# Patient Record
Sex: Female | Born: 1990 | Race: White | Hispanic: No | Marital: Married | Smoking: Current every day smoker
Health system: Southern US, Community
[De-identification: ages and names within clinical notes are randomized; demographics above are authoritative.]

## PROBLEM LIST (undated history)

## (undated) DIAGNOSIS — F419 Anxiety disorder, unspecified: Secondary | ICD-10-CM

## (undated) DIAGNOSIS — F329 Major depressive disorder, single episode, unspecified: Secondary | ICD-10-CM

## (undated) DIAGNOSIS — M50223 Other cervical disc displacement at C6-C7 level: Secondary | ICD-10-CM

## (undated) DIAGNOSIS — F32A Depression, unspecified: Secondary | ICD-10-CM

## (undated) HISTORY — PX: FOOT SURGERY: SHX648

---

## 2013-09-13 ENCOUNTER — Encounter (HOSPITAL_BASED_OUTPATIENT_CLINIC_OR_DEPARTMENT_OTHER): Payer: Self-pay | Admitting: Emergency Medicine

## 2013-09-13 ENCOUNTER — Emergency Department (HOSPITAL_BASED_OUTPATIENT_CLINIC_OR_DEPARTMENT_OTHER)
Admission: EM | Admit: 2013-09-13 | Discharge: 2013-09-13 | Disposition: A | Discharge status: Home / Self Care | Payer: Self-pay | Attending: Emergency Medicine | Admitting: Emergency Medicine

## 2013-09-13 DIAGNOSIS — M791 Myalgia, unspecified site: Secondary | ICD-10-CM

## 2013-09-13 DIAGNOSIS — F17200 Nicotine dependence, unspecified, uncomplicated: Secondary | ICD-10-CM | POA: Insufficient documentation

## 2013-09-13 DIAGNOSIS — IMO0001 Reserved for inherently not codable concepts without codable children: Secondary | ICD-10-CM | POA: Insufficient documentation

## 2013-09-13 MED ORDER — HYDROCODONE-ACETAMINOPHEN 5-325 MG PO TABS: 1.0000 | ORAL_TABLET | ORAL | Status: DC | PRN

## 2013-09-13 MED ORDER — CYCLOBENZAPRINE HCL 5 MG PO TABS
5.0000 mg | ORAL_TABLET | Freq: Two times a day (BID) | ORAL | Status: DC | PRN
Start: 2013-09-13 — End: 2016-06-04

## 2013-09-13 NOTE — ED Provider Notes (Signed)
CSN: [id number]     Arrival date & time 09/13/13  1730 History   First MD Initiated Contact with Patient 09/13/13 1900     Chief Complaint  Patient presents with  . Numbness   (Consider location/radiation/quality/duration/timing/severity/associated sxs/prior Treatment) HPI Comments: Pt states that she got a toradol and benadryl shot about a month ago in her right upper arm:pt states that since then she has had pain in her whole upper right arm and now it is radiating up into her right neck:pt states that there is pain to where it almost feels numb:pt states that she has not had any problems with grip  The history is provided by the patient. No language interpreter was used.    History reviewed. No pertinent past medical history. Past Surgical History  Procedure Laterality Date  . Foot surgery     No family history on file. History  Substance Use Topics  . Smoking status: Current Some Day Smoker  . Smokeless tobacco: Not on file  . Alcohol Use: No   OB History   Grav Para Term Preterm Abortions TAB SAB Ect Mult Living                 Review of Systems  Constitutional: Negative.   Respiratory: Negative.   Cardiovascular: Negative.     Allergies  Pepto-bismol  Home Medications   Current Outpatient Rx  Name  Route  Sig  Dispense  Refill  . cyclobenzaprine (FLEXERIL) 5 MG tablet   Oral   Take 1 tablet (5 mg total) by mouth 2 (two) times daily as needed for muscle spasms.   15 tablet   0   . HYDROcodone-acetaminophen (NORCO/VICODIN) 5-325 MG per tablet   Oral   Take 1-2 tablets by mouth every 4 (four) hours as needed.   10 tablet   0    BP 114/84  Pulse 87  Temp(Src) 97.8 F (36.6 C) (Oral)  Resp 16  Ht 5' 7.5" (1.715 m)  Wt 200 lb (90.719 kg)  BMI 30.84 kg/m2  SpO2 100% Physical Exam  Nursing note and vitals reviewed. Constitutional: She is oriented to person, place, and time. She appears well-developed and well-nourished.  Cardiovascular: Normal rate and  regular rhythm.   Musculoskeletal: Normal range of motion.  Neurological: She is oriented to person, place, and time. She exhibits normal muscle tone. Coordination normal.  Skin:  No redness or swelling noted to the right upper arm    ED Course  Procedures (including critical care time) Labs Review Labs Reviewed - No data to display Imaging Review No results found.  EKG Interpretation   None       MDM   1. Muscle pain    Seems consistent with a cervical radiculopathy. Will treat with vicodin and flexeril   Teressa LowerVrinda Milik Gilreath, NP 09/13/13 1922

## 2013-09-13 NOTE — ED Provider Notes (Signed)
Medical screening examination/treatment/procedure(s) were performed by non-physician practitioner and as supervising physician I was immediately available for consultation/collaboration.  EKG Interpretation   None         Shanna CiscoMegan E Docherty, MD 09/13/13 1925

## 2013-09-13 NOTE — ED Notes (Signed)
Numbness in her right arm since getting and injection a month ago.

## 2013-09-13 NOTE — Discharge Instructions (Signed)
Musculoskeletal Pain °Musculoskeletal pain is muscle and boney aches and pains. These pains can occur in any part of the body. Your caregiver may treat you without knowing the cause of the pain. They may treat you if blood or urine tests, X-rays, and other tests were normal.  °CAUSES °There is often not a definite cause or reason for these pains. These pains may be caused by a type of germ (virus). The discomfort may also come from overuse. Overuse includes working out too hard when your body is not fit. Boney aches also come from weather changes. Bone is sensitive to atmospheric pressure changes. °HOME CARE INSTRUCTIONS  °· Ask when your test results will be ready. Make sure you get your test results. °· Only take over-the-counter or prescription medicines for pain, discomfort, or fever as directed by your caregiver. If you were given medications for your condition, do not drive, operate machinery or power tools, or sign legal documents for 24 hours. Do not drink alcohol. Do not take sleeping pills or other medications that may interfere with treatment. °· Continue all activities unless the activities cause more pain. When the pain lessens, slowly resume normal activities. Gradually increase the intensity and duration of the activities or exercise. °· During periods of severe pain, bed rest may be helpful. Lay or sit in any position that is comfortable. °· Putting ice on the injured area. °· Put ice in a bag. °· Place a towel between your skin and the bag. °· Leave the ice on for 15 to 20 minutes, 3 to 4 times a day. °· Follow up with your caregiver for continued problems and no reason can be found for the pain. If the pain becomes worse or does not go away, it may be necessary to repeat tests or do additional testing. Your caregiver may need to look further for a possible cause. °SEEK IMMEDIATE MEDICAL CARE IF: °· You have pain that is getting worse and is not relieved by medications. °· You develop chest pain  that is associated with shortness or breath, sweating, feeling sick to your stomach (nauseous), or throw up (vomit). °· Your pain becomes localized to the abdomen. °· You develop any new symptoms that seem different or that concern you. °MAKE SURE YOU:  °· Understand these instructions. °· Will watch your condition. °· Will get help right away if you are not doing well or get worse. °Document Released: 08/26/2005 Document Revised: 11/18/2011 Document Reviewed: 04/15/2008 °ExitCare® Patient Information ©2014 [employer]. ° °

## 2015-03-21 ENCOUNTER — Emergency Department (HOSPITAL_BASED_OUTPATIENT_CLINIC_OR_DEPARTMENT_OTHER)
Admission: EM | Admit: 2015-03-21 | Discharge: 2015-03-21 | Disposition: A | Discharge status: Home / Self Care | Payer: Self-pay | Attending: Emergency Medicine | Admitting: Emergency Medicine

## 2015-03-21 ENCOUNTER — Encounter (HOSPITAL_BASED_OUTPATIENT_CLINIC_OR_DEPARTMENT_OTHER): Payer: Self-pay

## 2015-03-21 DIAGNOSIS — Z720 Tobacco use: Secondary | ICD-10-CM | POA: Insufficient documentation

## 2015-03-21 DIAGNOSIS — L299 Pruritus, unspecified: Secondary | ICD-10-CM | POA: Insufficient documentation

## 2015-03-21 DIAGNOSIS — R61 Generalized hyperhidrosis: Secondary | ICD-10-CM | POA: Insufficient documentation

## 2015-03-21 DIAGNOSIS — R000 Tachycardia, unspecified: Secondary | ICD-10-CM | POA: Insufficient documentation

## 2015-03-21 DIAGNOSIS — Z3202 Encounter for pregnancy test, result negative: Secondary | ICD-10-CM | POA: Insufficient documentation

## 2015-03-21 LAB — RAPID URINE DRUG SCREEN, HOSP PERFORMED
AMPHETAMINES: NOT DETECTED
Barbiturates: NOT DETECTED
Benzodiazepines: NOT DETECTED
COCAINE: NOT DETECTED
Opiates: NOT DETECTED
Tetrahydrocannabinol: NOT DETECTED

## 2015-03-21 LAB — PREGNANCY, URINE: Preg Test, Ur: NEGATIVE

## 2015-03-21 MED ORDER — SODIUM CHLORIDE 0.9 % IV BOLUS (SEPSIS): 1000.0000 mL | Freq: Once | INTRAVENOUS | Status: DC

## 2015-03-21 NOTE — ED Notes (Signed)
Pt refuses IV and IV Fluids.

## 2015-03-21 NOTE — ED Provider Notes (Signed)
CSN: [id number]     Arrival date & time 03/21/15  1216 History   First MD Initiated Contact with Patient 03/21/15 1231     Chief Complaint  Patient presents with  . Pruritis     (Consider location/radiation/quality/duration/timing/severity/associated sxs/prior Treatment) HPI Joann Hunt is a 24 y.o. female who comes in for evaluation of itchiness. Patient states for the past 3 weeks she has had intense itchiness all over her body. She reports going to the [facility] this morning and receiving steroid's, Zantac and Atarax, which made the itching worse. She took Benadryl last night without relief. She denies any fevers, chills, vomiting, abdominal pain, new medications, detergents, environmental exposures, insect bites or recent travel. She denies any illicit drug use, specifically opiates. History reviewed. No pertinent past medical history. Past Surgical History  Procedure Laterality Date  . Foot surgery     No family history on file. History  Substance Use Topics  . Smoking status: Current Every Day Smoker  . Smokeless tobacco: Not on file  . Alcohol Use: Not on file   OB History    No data available     Review of Systems A 10 point review of systems was completed and was negative except for pertinent positives and negatives as mentioned in the history of present illness     Allergies  Pepto-bismol  Home Medications   Prior to Admission medications   Medication Sig Start Date End Date Taking? Authorizing Provider  cyclobenzaprine (FLEXERIL) 5 MG tablet Take 1 tablet (5 mg total) by mouth 2 (two) times daily as needed for muscle spasms. 09/13/13   Teressa LowerVrinda Pickering, NP   BP 108/57 mmHg  Pulse 105  Temp(Src) 98 F (36.7 C) (Oral)  Resp 18  Ht 5\' 7"  (1.702 m)  Wt 232 lb (105.235 kg)  BMI 36.33 kg/m2  SpO2 99%  LMP 02/13/2015 Physical Exam  Constitutional: She is oriented to person, place, and time. She appears well-developed and well-nourished.  Diaphoresis noted  to palms and soles of feet.  HENT:  Head: Normocephalic and atraumatic.  Mouth/Throat: Oropharynx is clear and moist. No oropharyngeal exudate.  Eyes: Conjunctivae are normal. Pupils are equal, round, and reactive to light. Right eye exhibits no discharge. Left eye exhibits no discharge. No scleral icterus.  Neck: Normal range of motion. Neck supple.  Cardiovascular: Regular rhythm and normal heart sounds.   Mild tachycardia  Pulmonary/Chest: Effort normal and breath sounds normal. No respiratory distress. She has no wheezes. She has no rales.  Abdominal: Soft. There is no tenderness.  Musculoskeletal: She exhibits no tenderness.  Neurological: She is alert and oriented to person, place, and time.  Cranial Nerves II-XII grossly intact  Skin: Skin is warm. No rash noted. She is diaphoretic.  No lesions noted anywhere on body.  Psychiatric: She has a normal mood and affect.  Nursing note and vitals reviewed.   ED Course  Procedures (including critical care time) Labs Review Labs Reviewed  PREGNANCY, URINE  URINE RAPID DRUG SCREEN, HOSP PERFORMED    Imaging Review No results found.   EKG Interpretation None     Meds given in ED:  Medications - No data to display  New Prescriptions   No medications on file   Filed Vitals:   03/21/15 1230 03/21/15 1321  BP: 122/73 108/57  Pulse: 112 105  Temp: 98 F (36.7 C)   TempSrc: Oral   Resp: 18 18  Height: 5\' 7"  (1.702 m)   Weight: 232 lb (105.235  kg)   SpO2: 100% 99%    MDM  Vitals stable , tachycardia is improving -afebrile Pt resting comfortably in ED. Upon reevaluation, diaphoresis has resolved. Patient appears comfortable lying on the bed. She has refused IV fluids, oral fluids. PE--physical exam is otherwise unremarkable. Labwork--UDS is negative, pregnancy negative.  DDX--patient reports she has been given a prescription for prednisone, Atarax, Zantac, but has not filled any of these prescriptions. I recommended  she feel these prescriptions and follow-up with PCP for further evaluation and management of symptoms. No evidence of other acute or emergent pathology at this time  I discussed all relevant lab findings and imaging results with pt and they verbalized understanding. Discussed f/u with PCP within 48 hrs and return precautions, pt very amenable to plan. Prior to patient discharge, I discussed and reviewed this case with Dr.Wofford   Final diagnoses:  Itchy skin       Joycie Peek, PA-C 03/21/15 1425  Blake Divine, MD 03/21/15 1541

## 2015-03-21 NOTE — Discharge Instructions (Signed)
You were evaluated in the ED today for itchiness. There does not appear to be an emergent cause her symptoms at this time. Please fill the prescriptions were given earlier. Follow-up with primary care for further elevation management of your symptoms. Return to ED for worsening symptoms.  Pruritus  Pruritus is an itch. There are many different problems that can cause an itch. Dry skin is one of the most common causes of itching. Most cases of itching do not require medical attention.  HOME CARE INSTRUCTIONS  Make sure your skin is moistened on a regular basis. A moisturizer that contains petroleum jelly is best for keeping moisture in your skin. If you develop a rash, you may try the following for relief:   Use corticosteroid cream.  Apply cool compresses to the affected areas.  Bathe with Epsom salts or baking soda in the bathwater.  Soak in colloidal oatmeal baths. These are available at your pharmacy.  Apply baking soda paste to the rash. Stir water into baking soda until it reaches a paste-like consistency.  Use an anti-itch lotion.  Take over-the-counter diphenhydramine medicine by mouth as the instructions direct.  Avoid scratching. Scratching may cause the rash to become infected. If itching is very bad, your caregiver may suggest prescription lotions or creams to lessen your symptoms.  Avoid hot showers, which can make itching worse. A cold shower may help with itching as long as you use a moisturizer after the shower. SEEK MEDICAL CARE IF: The itching does not go away after several days. Document Released: 05/08/2011 Document Revised: 01/10/2014 Document Reviewed: 05/08/2011 [facility] Patient Information 2015 [place], [place]. This information is not intended to replace advice given to you by your health care provider. Make sure you discuss any questions you have with your health care provider.

## 2015-03-21 NOTE — ED Notes (Addendum)
Pt reports generalized pruritis "for weeks" unrelieved after 1 dose of prednisone, zantac and atarax administered this am in another ER.  Given RX, however has not filled prescriptions.  No visible rash, denies fever or myalgias.  Report abdominal discomfort x 3 weeks that is relieved after taking TUMS.  Denies new foods, soaps, medications.

## 2015-03-21 NOTE — ED Notes (Signed)
Discussed IVF with pt and pt continues to decline. States "I'm not dehydrated and I don't want them".

## 2015-03-21 NOTE — ED Notes (Signed)
C/o itching all over body x 3 weeks-denies rash-[facility]-rx prednisone, anti itch med, zantac-states meds "are not working"

## 2015-03-21 NOTE — ED Notes (Signed)
Patient denies pain and is resting comfortably.  

## 2016-03-11 ENCOUNTER — Emergency Department (HOSPITAL_BASED_OUTPATIENT_CLINIC_OR_DEPARTMENT_OTHER): Payer: Medicaid - Out of State

## 2016-03-11 ENCOUNTER — Encounter (HOSPITAL_BASED_OUTPATIENT_CLINIC_OR_DEPARTMENT_OTHER): Payer: Self-pay | Admitting: *Deleted

## 2016-03-11 ENCOUNTER — Emergency Department (HOSPITAL_BASED_OUTPATIENT_CLINIC_OR_DEPARTMENT_OTHER)
Admission: EM | Admit: 2016-03-11 | Discharge: 2016-03-11 | Disposition: A | Discharge status: Home / Self Care | Payer: Medicaid - Out of State | Attending: Emergency Medicine | Admitting: Emergency Medicine

## 2016-03-11 DIAGNOSIS — R109 Unspecified abdominal pain: Secondary | ICD-10-CM | POA: Insufficient documentation

## 2016-03-11 DIAGNOSIS — F17200 Nicotine dependence, unspecified, uncomplicated: Secondary | ICD-10-CM | POA: Diagnosis not present

## 2016-03-11 DIAGNOSIS — G479 Sleep disorder, unspecified: Secondary | ICD-10-CM | POA: Diagnosis not present

## 2016-03-11 DIAGNOSIS — M549 Dorsalgia, unspecified: Secondary | ICD-10-CM | POA: Diagnosis present

## 2016-03-11 DIAGNOSIS — N12 Tubulo-interstitial nephritis, not specified as acute or chronic: Secondary | ICD-10-CM | POA: Diagnosis not present

## 2016-03-11 DIAGNOSIS — R52 Pain, unspecified: Secondary | ICD-10-CM

## 2016-03-11 LAB — URINALYSIS, ROUTINE W REFLEX MICROSCOPIC
Bilirubin Urine: NEGATIVE
Glucose, UA: NEGATIVE mg/dL
Ketones, ur: NEGATIVE mg/dL
Nitrite: NEGATIVE
Protein, ur: NEGATIVE mg/dL
Specific Gravity, Urine: 1.027 (ref 1.005–1.030)
pH: 7 (ref 5.0–8.0)

## 2016-03-11 LAB — PREGNANCY, URINE: Preg Test, Ur: NEGATIVE

## 2016-03-11 LAB — URINE MICROSCOPIC-ADD ON

## 2016-03-11 MED ORDER — DIAZEPAM 5 MG PO TABS: 10.0000 mg | ORAL_TABLET | Freq: Three times a day (TID) | ORAL | Status: DC | PRN

## 2016-03-11 MED ORDER — OXYCODONE-ACETAMINOPHEN 5-325 MG PO TABS: 1.0000 | ORAL_TABLET | Freq: Four times a day (QID) | ORAL | Status: DC | PRN

## 2016-03-11 MED ORDER — CEPHALEXIN 500 MG PO CAPS: 500.0000 mg | ORAL_CAPSULE | Freq: Four times a day (QID) | ORAL | Status: DC

## 2016-03-11 MED ORDER — OXYCODONE-ACETAMINOPHEN 5-325 MG PO TABS
2.0000 | ORAL_TABLET | Freq: Once | ORAL | Status: AC
  Administered 2016-03-11: 2 via ORAL
  Filled 2016-03-11: qty 2

## 2016-03-11 MED ORDER — ONDANSETRON 4 MG PO TBDP
4.0000 mg | ORAL_TABLET | Freq: Once | ORAL | Status: AC
  Administered 2016-03-11: 4 mg via ORAL
  Filled 2016-03-11: qty 1

## 2016-03-11 MED ORDER — CEPHALEXIN 250 MG PO CAPS
500.0000 mg | ORAL_CAPSULE | Freq: Once | ORAL | Status: AC
  Administered 2016-03-11: 500 mg via ORAL
  Filled 2016-03-11: qty 2

## 2016-03-11 NOTE — ED Notes (Signed)
Patient transported to CT via stretcher per tech. 
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
## 2016-03-11 NOTE — ED Notes (Signed)
MD at bedside. 

## 2016-03-11 NOTE — ED Notes (Signed)
States she has a pinched nerve in her neck. She has had back pain x 4 days. [facility] and had xrays that told her to limit weight lifting. She was given Valium. No relief and now her back is numb. She is ambulatory with guarding.

## 2016-03-13 LAB — URINE CULTURE

## 2016-06-04 ENCOUNTER — Emergency Department (HOSPITAL_BASED_OUTPATIENT_CLINIC_OR_DEPARTMENT_OTHER)
Admission: EM | Admit: 2016-06-04 | Discharge: 2016-06-04 | Disposition: A | Discharge status: Home / Self Care | Payer: Medicaid - Out of State | Attending: Emergency Medicine | Admitting: Emergency Medicine

## 2016-06-04 ENCOUNTER — Encounter (HOSPITAL_BASED_OUTPATIENT_CLINIC_OR_DEPARTMENT_OTHER): Payer: Self-pay | Admitting: Emergency Medicine

## 2016-06-04 DIAGNOSIS — T7840XA Allergy, unspecified, initial encounter: Secondary | ICD-10-CM

## 2016-06-04 DIAGNOSIS — Z87891 Personal history of nicotine dependence: Secondary | ICD-10-CM | POA: Insufficient documentation

## 2016-06-04 DIAGNOSIS — R112 Nausea with vomiting, unspecified: Secondary | ICD-10-CM

## 2016-06-04 LAB — CBC WITH DIFFERENTIAL/PLATELET
Basophils Absolute: 0 10*3/uL (ref 0.0–0.1)
Basophils Relative: 0 %
EOS PCT: 1 %
Eosinophils Absolute: 0.1 10*3/uL (ref 0.0–0.7)
HEMATOCRIT: 34.7 % — AB (ref 36.0–46.0)
Hemoglobin: 11.5 g/dL — ABNORMAL LOW (ref 12.0–15.0)
LYMPHS ABS: 2 10*3/uL (ref 0.7–4.0)
LYMPHS PCT: 24 %
MCH: 27.8 pg (ref 26.0–34.0)
MCHC: 33.1 g/dL (ref 30.0–36.0)
MCV: 83.8 fL (ref 78.0–100.0)
MONO ABS: 0.6 10*3/uL (ref 0.1–1.0)
MONOS PCT: 7 %
NEUTROS ABS: 5.7 10*3/uL (ref 1.7–7.7)
Neutrophils Relative %: 68 %
PLATELETS: 280 10*3/uL (ref 150–400)
RBC: 4.14 MIL/uL (ref 3.87–5.11)
RDW: 14.3 % (ref 11.5–15.5)
WBC: 8.3 10*3/uL (ref 4.0–10.5)

## 2016-06-04 LAB — BASIC METABOLIC PANEL
Anion gap: 7 (ref 5–15)
BUN: 15 mg/dL (ref 6–20)
CALCIUM: 8.8 mg/dL — AB (ref 8.9–10.3)
CO2: 23 mmol/L (ref 22–32)
Chloride: 108 mmol/L (ref 101–111)
Creatinine, Ser: 0.71 mg/dL (ref 0.44–1.00)
GFR calc Af Amer: >60 mL/min (ref >60)
Glucose, Bld: 100 mg/dL — ABNORMAL HIGH (ref 65–99)
Potassium: 3.1 mmol/L — ABNORMAL LOW (ref 3.5–5.1)
Sodium: 138 mmol/L (ref 135–145)

## 2016-06-04 MED ORDER — METHYLPREDNISOLONE SODIUM SUCC 125 MG IJ SOLR
125.0000 mg | Freq: Once | INTRAMUSCULAR | Status: AC
  Administered 2016-06-04: 125 mg via INTRAVENOUS
  Filled 2016-06-04: qty 2

## 2016-06-04 MED ORDER — SODIUM CHLORIDE 0.9 % IV BOLUS (SEPSIS)
1000.0000 mL | Freq: Once | INTRAVENOUS | Status: AC
  Administered 2016-06-04: 1000 mL via INTRAVENOUS

## 2016-06-04 MED ORDER — FAMOTIDINE IN NACL 20-0.9 MG/50ML-% IV SOLN
20.0000 mg | Freq: Once | INTRAVENOUS | Status: AC
  Administered 2016-06-04: 20 mg via INTRAVENOUS
  Filled 2016-06-04: qty 50

## 2016-06-04 MED ORDER — PROMETHAZINE HCL 25 MG/ML IJ SOLN
25.0000 mg | Freq: Once | INTRAMUSCULAR | Status: AC
  Administered 2016-06-04: 25 mg via INTRAVENOUS
  Filled 2016-06-04: qty 1

## 2016-06-04 MED ORDER — FAMOTIDINE IN NACL 20-0.9 MG/50ML-% IV SOLN
INTRAVENOUS | Status: AC
  Filled 2016-06-04: qty 50

## 2016-06-04 MED ORDER — DIPHENHYDRAMINE HCL 50 MG/ML IJ SOLN
50.0000 mg | Freq: Once | INTRAMUSCULAR | Status: AC
  Administered 2016-06-04: 50 mg via INTRAVENOUS
  Filled 2016-06-04: qty 1

## 2016-06-04 MED ORDER — ONDANSETRON HCL 4 MG/2ML IJ SOLN
4.0000 mg | Freq: Once | INTRAMUSCULAR | Status: AC
  Administered 2016-06-04: 4 mg via INTRAVENOUS
  Filled 2016-06-04: qty 2

## 2016-06-04 MED ORDER — EPINEPHRINE 0.3 MG/0.3ML IJ SOAJ: 0.3000 mg | Freq: Once | INTRAMUSCULAR | 0 refills | Status: AC

## 2016-06-04 MED ORDER — ONDANSETRON 4 MG PO TBDP: ORAL_TABLET | ORAL | 0 refills | Status: AC

## 2016-06-04 MED ORDER — PREDNISONE 50 MG PO TABS: 50.0000 mg | ORAL_TABLET | Freq: Every day | ORAL | 0 refills | Status: DC

## 2016-06-04 NOTE — ED Provider Notes (Signed)
[facility]   CSN: [id number] Arrival date & time: 06/04/16  1118     History   Chief Complaint Chief Complaint  Patient presents with  . Allergic Reaction    HPI Joann Hunt is a 25 y.o. female who presented with possible allergic reaction. States around 9:30 AM, she was outside and may have been bitten by a bug on the bilateral forearms. Shortly afterwards, she also some swelling in the area that resolved and then about half an hour later, she noticed rash on her anterior chest as well as face. Has subjective shortness of breath as well. Denies any new foods. He states that she had previous allergic reaction to Pepto-Bismol. She denies being pregnant.   The history is provided by the patient.    History reviewed. No pertinent past medical history.  There are no active problems to display for this patient.   Past Surgical History:  Procedure Laterality Date  . FOOT SURGERY      OB History    No data available       Home Medications    Prior to Admission medications   Not on File    Family History History reviewed. No pertinent family history.  Social History Social History  Substance Use Topics  . Smoking status: Former Games developermoker  . Smokeless tobacco: Current User  . Alcohol use No     Allergies   Pepto-bismol [bismuth]   Review of Systems Review of Systems  Skin: Positive for rash.  All other systems reviewed and are negative.    Physical Exam Updated Vital Signs BP 131/93   Pulse 101   Temp 98.2 F (36.8 C) (Oral)   Resp 18   Ht 5' 7.5" (1.715 m)   Wt 230 lb (104.3 kg)   SpO2 99%   BMI 35.49 kg/m   Physical Exam  Constitutional: She is oriented to person, place, and time.  Anxious   HENT:  Head: Normocephalic.  Right Ear: External ear normal.  Left Ear: External ear normal.  Posterior pharynx clear   Eyes: EOM are normal. Pupils are equal, round, and reactive to light.  Neck: Normal range of motion.    No stridor   Cardiovascular: Normal rate, regular rhythm and normal heart sounds.   Pulmonary/Chest: Effort normal and breath sounds normal.  No wheezing   Abdominal: Soft. Bowel sounds are normal. She exhibits no distension. There is no tenderness. There is no guarding.  Musculoskeletal: Normal range of motion.  Neurological: She is alert and oriented to person, place, and time.  Skin: Skin is warm.  Urticaria on anterior chest and face. No mucous membrane involvement. No back or extremity involvement   Psychiatric: She has a normal mood and affect.  Nursing note and vitals reviewed.    ED Treatments / Results  Labs (all labs ordered are listed, but only abnormal results are displayed) Labs Reviewed  CBC WITH DIFFERENTIAL/PLATELET - Abnormal; Notable for the following:       Result Value   Hemoglobin 11.5 (*)    HCT 34.7 (*)    All other components within normal limits  BASIC METABOLIC PANEL - Abnormal; Notable for the following:    Potassium 3.1 (*)    Glucose, Bld 100 (*)    Calcium 8.8 (*)    All other components within normal limits    EKG  EKG Interpretation None       Radiology No results found.  Procedures Procedures (including critical care  time)  Medications Ordered in ED Medications  famotidine (PEPCID) 20-0.9 MG/50ML-% IVPB (  Not Given 06/04/16 1233)  sodium chloride 0.9 % bolus 1,000 mL (0 mLs Intravenous Stopped 06/04/16 1319)  diphenhydrAMINE (BENADRYL) injection 50 mg (50 mg Intravenous Given 06/04/16 1159)  methylPREDNISolone sodium succinate (SOLU-MEDROL) 125 mg/2 mL injection 125 mg (125 mg Intravenous Given 06/04/16 1201)  famotidine (PEPCID) IVPB 20 mg premix (0 mg Intravenous Stopped 06/04/16 1234)  promethazine (PHENERGAN) injection 25 mg (25 mg Intravenous Given 06/04/16 1236)  ondansetron (ZOFRAN) injection 4 mg (4 mg Intravenous Given 06/04/16 1319)     Initial Impression / Assessment and Plan / ED Course  I have reviewed the triage vital  signs and the nursing notes.  Pertinent labs & imaging results that were available during my care of the patient were reviewed by me and considered in my medical decision making (see chart for details).  Clinical Course    Joann Hunt is a 25 y.o. female here with possible allergic reaction. Has subjective shortness of breath but OP clear and no stridor and lungs clear. Has some urticaria so likely allergic reaction. Will give pepcid, benadryl, solumedrol and reassess.   1:24 PM Given steroids, benadryl, pepcid. Had some nausea afterwards. Given zofran, phenergan and felt better. Rash improved. Has no subjective shortness of breath anymore. Will dc home with prednisone, benadryl, zofran prn.    Final Clinical Impressions(s) / ED Diagnoses   Final diagnoses:  None    New Prescriptions New Prescriptions   No medications on file     Charlynne Pander, MD 06/04/16 1325

## 2016-06-04 NOTE — ED Notes (Signed)
Patient denies pain and is resting comfortably.  

## 2016-06-04 NOTE — Discharge Instructions (Signed)
Take prednisone as prescribed. If you feel nauseated from it, take zofran as needed.   Take benadryl 50 mg every 6 hrs for rash and itchiness.   Carry epi pen with you. If you feel that your throat is closing and you have worse trouble breathing, give yourself a shot and come to the ED.   See your doctor   Return to ER if you have worse rash, trouble breathing, throat closing, vomiting, abdominal pain

## 2016-06-04 NOTE — ED Triage Notes (Signed)
Pt states she was at work and something bit her around 930, states she started getting hives and having SOB.  Pt talking in full sentences in triage

## 2016-06-26 ENCOUNTER — Emergency Department (HOSPITAL_BASED_OUTPATIENT_CLINIC_OR_DEPARTMENT_OTHER): Payer: Self-pay

## 2016-06-26 ENCOUNTER — Emergency Department (HOSPITAL_BASED_OUTPATIENT_CLINIC_OR_DEPARTMENT_OTHER)
Admission: EM | Admit: 2016-06-26 | Discharge: 2016-06-26 | Disposition: A | Discharge status: Home / Self Care | Payer: Self-pay | Attending: Emergency Medicine | Admitting: Emergency Medicine

## 2016-06-26 ENCOUNTER — Encounter (HOSPITAL_BASED_OUTPATIENT_CLINIC_OR_DEPARTMENT_OTHER): Payer: Self-pay | Admitting: Emergency Medicine

## 2016-06-26 DIAGNOSIS — R059 Cough, unspecified: Secondary | ICD-10-CM

## 2016-06-26 DIAGNOSIS — R05 Cough: Secondary | ICD-10-CM | POA: Insufficient documentation

## 2016-06-26 DIAGNOSIS — Z87891 Personal history of nicotine dependence: Secondary | ICD-10-CM | POA: Insufficient documentation

## 2016-06-26 MED ORDER — DEXAMETHASONE SODIUM PHOSPHATE 10 MG/ML IJ SOLN
10.0000 mg | Freq: Once | INTRAMUSCULAR | Status: AC
  Administered 2016-06-26: 10 mg via INTRAMUSCULAR
  Filled 2016-06-26: qty 1

## 2016-06-26 MED ORDER — HYDROCODONE-HOMATROPINE 5-1.5 MG/5ML PO SYRP: 5.0000 mL | ORAL_SOLUTION | Freq: Four times a day (QID) | ORAL | 0 refills | Status: DC | PRN

## 2016-06-26 MED FILL — HYDROCODONE-HOMATROPINE SOL: 5-1.5 | 5 days supply | Qty: 120 | Fill #0

## 2016-06-26 NOTE — ED Triage Notes (Addendum)
Pt c/o np cough x 3 wks since she was "bitten by something at work." Sts she also has a migraine now x 3-4 wks- "it gets worse when I put my hair up." Also c/o bilateral breast soreness.

## 2016-06-26 NOTE — ED Provider Notes (Signed)
[facility]   CSN: [id number] Arrival date & time: 06/26/16  0935     History   Chief Complaint Chief Complaint  Patient presents with  . Cough    HPI Joann Hunt is a 25 y.o. female.  HPI Patient presents with persistent cough for the last 3-4 weeks.  Also now has a headache.  Says she's had some wheezing.  The cough has been nonproductive.  When patient coughs hard she sometimes has a release of urine.  Denies any dysuria or back pain.  No change in odor of urine.  She does not smoke but does vape. History reviewed. No pertinent past medical history.  There are no active problems to display for this patient.   Past Surgical History:  Procedure Laterality Date  . FOOT SURGERY      OB History    No data available       Home Medications    Prior to Admission medications   Medication Sig Start Date End Date Taking? Authorizing Provider  HYDROcodone-homatropine (HYCODAN) 5-1.5 MG/5ML syrup Take 5 mLs by mouth every 6 (six) hours as needed for cough. 06/26/16   Nelva Nayobert Tahesha Skeet, MD  ondansetron (ZOFRAN ODT) 4 MG disintegrating tablet 4mg  ODT q6 hours prn nausea/vomit 06/04/16   Charlynne Panderavid Hsienta Yao, MD  predniSONE (DELTASONE) 50 MG tablet Take 1 tablet (50 mg total) by mouth daily. 06/04/16   Charlynne Panderavid Hsienta Yao, MD    Family History History reviewed. No pertinent family history.  Social History Social History  Substance Use Topics  . Smoking status: Former Games developermoker  . Smokeless tobacco: Current User  . Alcohol use No     Allergies   Pepto-bismol [bismuth]   Review of Systems Review of Systems All other systems reviewed and are negative  Physical Exam Updated Vital Signs BP 129/80   Pulse 75   Temp 98.3 F (36.8 C) (Oral)   Resp 16   Ht 5' 7.5" (1.715 m)   Wt 220 lb (99.8 kg)   LMP 05/22/2016   SpO2 100%   BMI 33.95 kg/m   Physical Exam  Physical Exam  Nursing note and vitals reviewed. Constitutional: She is oriented to  person, place, and time. She appears well-developed and well-nourished. No distress.  HENT:  Head: Normocephalic and atraumatic.  Eyes: Pupils are equal, round, and reactive to light.  Neck: Normal range of motion.  Cardiovascular: Normal rate and intact distal pulses.   Pulmonary/Chest: No respiratory distress.  No definite wheezing noted.  Patient able to speak in full sentences.   Abdominal: Normal appearance. She exhibits no distension.  Musculoskeletal: Normal range of motion.  no CVA tenderness. Neurological: She is alert and oriented to person, place, and time. No cranial nerve deficit.  Skin: Skin is warm and dry. No rash noted.  Psychiatric: She has a normal mood and affect. Her behavior is normal.   ED Treatments / Results  Labs (all labs ordered are listed, but only abnormal results are displayed) Labs Reviewed - No data to display  EKG  EKG Interpretation None       Radiology Dg Chest 2 View  Result Date: 06/26/2016 CLINICAL DATA:  Cough x3 weeks EXAM: CHEST  2 VIEW COMPARISON:  None. FINDINGS: Lungs are clear.  No pleural effusion or pneumothorax. The heart is normal in size. Visualized osseous structures are within normal limits. IMPRESSION: Normal chest radiographs. Electronically Signed   By: Charline BillsSriyesh  Krishnan M.D.   On: 06/26/2016 10:21  Procedures Procedures (including critical care time)  Medications Ordered in ED Medications  dexamethasone (DECADRON) injection 10 mg (not administered)     Initial Impression / Assessment and Plan / ED Course  I have reviewed the triage vital signs and the nursing notes.  Pertinent labs & imaging results that were available during my care of the patient were reviewed by me and considered in my medical decision making (see chart for details).  Clinical Course      Final Clinical Impressions(s) / ED Diagnoses   Final diagnoses:  Cough    New Prescriptions New Prescriptions   HYDROCODONE-HOMATROPINE  (HYCODAN) 5-1.5 MG/5ML SYRUP    Take 5 mLs by mouth every 6 (six) hours as needed for cough.     Nelva Nay, MD 06/26/16 1059

## 2017-05-17 ENCOUNTER — Encounter (HOSPITAL_BASED_OUTPATIENT_CLINIC_OR_DEPARTMENT_OTHER): Payer: Self-pay | Admitting: Emergency Medicine

## 2017-05-17 ENCOUNTER — Emergency Department (HOSPITAL_BASED_OUTPATIENT_CLINIC_OR_DEPARTMENT_OTHER)
Admission: EM | Admit: 2017-05-17 | Discharge: 2017-05-17 | Disposition: A | Discharge status: Home / Self Care | Payer: Medicaid Other | Attending: Emergency Medicine | Admitting: Emergency Medicine

## 2017-05-17 DIAGNOSIS — M791 Myalgia: Secondary | ICD-10-CM | POA: Diagnosis present

## 2017-05-17 DIAGNOSIS — Z87891 Personal history of nicotine dependence: Secondary | ICD-10-CM | POA: Insufficient documentation

## 2017-05-17 DIAGNOSIS — B349 Viral infection, unspecified: Secondary | ICD-10-CM | POA: Diagnosis not present

## 2017-05-17 DIAGNOSIS — Z79899 Other long term (current) drug therapy: Secondary | ICD-10-CM | POA: Diagnosis not present

## 2017-05-17 HISTORY — DX: Anxiety disorder, unspecified: F41.9

## 2017-05-17 HISTORY — DX: Depression, unspecified: F32.A

## 2017-05-17 HISTORY — DX: Major depressive disorder, single episode, unspecified: F32.9

## 2017-05-17 MED ORDER — IBUPROFEN 800 MG PO TABS: 800.0000 mg | ORAL_TABLET | Freq: Three times a day (TID) | ORAL | 0 refills | Status: AC

## 2017-05-17 MED ORDER — HYDROCODONE-HOMATROPINE 5-1.5 MG/5ML PO SYRP: 5.0000 mL | ORAL_SOLUTION | Freq: Four times a day (QID) | ORAL | 0 refills | Status: DC | PRN

## 2017-05-17 MED ORDER — ACETAMINOPHEN 500 MG PO TABS
1000.0000 mg | ORAL_TABLET | Freq: Once | ORAL | Status: AC
  Administered 2017-05-17: 1000 mg via ORAL
  Filled 2017-05-17: qty 2

## 2017-05-17 MED ORDER — IBUPROFEN 800 MG PO TABS
800.0000 mg | ORAL_TABLET | Freq: Once | ORAL | Status: AC
Start: 2017-05-17 — End: 2017-05-17
  Administered 2017-05-17: 800 mg via ORAL
  Filled 2017-05-17: qty 1

## 2017-05-17 NOTE — ED Triage Notes (Signed)
Patient states that she has felt bad over the last couple of days. Reports that she has had generalized body aches, nasal congestion and a dry cough. Patient reports that Tylenol, Motrin and Aleve over the counter today

## 2017-05-17 NOTE — Discharge Instructions (Signed)
1. Take ibuprofen 800 mg every 8 hours for body aches and fevers. 2. Buy over-the-counter Tylenol severe cold and sinus medication. You may get the daytime preparation and the nighttime preparation( to help you sleep). 3. You may take 1 teaspoon Hycodan cough syrup as prescribed in the evening for severe cough.

## 2017-05-17 NOTE — ED Notes (Signed)
ED Provider at bedside. 

## 2017-05-17 NOTE — ED Provider Notes (Signed)
[facility]   CSN: [id number] Arrival date & time: 05/17/17  1943     History   Chief Complaint Chief Complaint  Patient presents with  . Generalized Body Aches    HPI Joann Hunt is a 26 y.o. female.  HPI Patient reports that her son is sick. She is being seen together with him in the emergency department. He is in kindergarten and he came home sick from school last Thursday. She reports he had a lot of nasal congestion, fever and cough. 2 days later, patient reports she developed severe generalized body aches subjective fever, nasal congestion and ear fullness, cough with chest pain and moderate diarrhea. Patient reports she's been taking ibuprofen and Tylenol without relief. She reports she feels terrible. Patient works in a nursing home. Past Medical History:  Diagnosis Date  . Anxiety   . Depression     There are no active problems to display for this patient.   Past Surgical History:  Procedure Laterality Date  . FOOT SURGERY      OB History    No data available       Home Medications    Prior to Admission medications   Medication Sig Start Date End Date Taking? Authorizing Provider  FLUoxetine (PROZAC) 10 MG tablet Take 10 mg by mouth daily.   Yes [provider]  sertraline (ZOLOFT) 25 MG tablet Take 25 mg by mouth daily.   Yes [provider]  HYDROcodone-homatropine (HYCODAN) 5-1.5 MG/5ML syrup Take 5 mLs by mouth every 6 (six) hours as needed for cough. 06/26/16   Nelva Nay, MD  HYDROcodone-homatropine [facility]) 5-1.5 MG/5ML syrup Take 5 mLs by mouth every 6 (six) hours as needed for cough. 05/17/17   Arby Barrette, MD  ibuprofen (ADVIL,MOTRIN) 800 MG tablet Take 1 tablet (800 mg total) by mouth 3 (three) times daily. 05/17/17   Arby Barrette, MD  ondansetron (ZOFRAN ODT) 4 MG disintegrating tablet  ODT q6 hours prn nausea/vomit 06/04/16   Charlynne Pander, MD  predniSONE (DELTASONE) 50 MG tablet Take  1 tablet (50 mg total) by mouth daily. 06/04/16   Charlynne Pander, MD    Family History History reviewed. No pertinent family history.  Social History Social History  Substance Use Topics  . Smoking status: Former Games developer  . Smokeless tobacco: Current User  . Alcohol use No     Allergies   Pepto-bismol [bismuth]   Review of Systems Review of Systems 10 Systems reviewed and are negative for acute change except as noted in the HPI.   Physical Exam Updated Vital Signs BP 133/84 (BP Location: Left Arm)   Pulse (!) 104   Temp 99.2 F (37.3 C) (Oral)   Resp 20   Ht 5' 7.5" (1.715 m)   Wt 99.8 kg (220 lb)   LMP 04/20/2017   SpO2 100%   BMI 33.95 kg/m   Physical Exam  Constitutional: She is oriented to person, place, and time.  Patient is nontoxic and alert. She appears mildly uncomfortable and congested but no respiratory distress, normal color and level of alertness.  HENT:  Head: Normocephalic and atraumatic.  Bilateral TMs normal. Nares are patent with mild clear congestion. Posterior oropharynx widely patent. No erythema or exudate of the tonsillar pillars.  Eyes: Pupils are equal, round, and reactive to light. EOM are normal.  Neck: Neck supple.  Cardiovascular: Normal rate, regular rhythm, normal heart sounds and intact distal pulses.   Pulmonary/Chest: Effort normal and breath  sounds normal.  Intermittent dry cough.  Abdominal: Soft. She exhibits no distension. There is no tenderness. There is no guarding.  Musculoskeletal: Normal range of motion. She exhibits no edema or tenderness.  Lymphadenopathy:    She has no cervical adenopathy.  Neurological: She is alert and oriented to person, place, and time. No cranial nerve deficit. She exhibits normal muscle tone.  Skin: Skin is warm and dry. No rash noted.  Psychiatric: She has a normal mood and affect.     ED Treatments / Results  Labs (all labs ordered are listed, but only abnormal results are  displayed) Labs Reviewed - No data to display  EKG  EKG Interpretation None       Radiology No results found.  Procedures Procedures (including critical care time)  Medications Ordered in ED Medications  ibuprofen (ADVIL,MOTRIN) tablet 800 mg (800 mg Oral Given 05/17/17 2228)  acetaminophen (TYLENOL) tablet 1,000 mg (1,000 mg Oral Given 05/17/17 2228)     Initial Impression / Assessment and Plan / ED Course  I have reviewed the triage vital signs and the nursing notes.  Pertinent labs & imaging results that were available during my care of the patient were reviewed by me and considered in my medical decision making (see chart for details).     Final Clinical Impressions(s) / ED Diagnoses   Final diagnoses:  Viral illness   Patient is clinically nontoxic with no respiratory distress and normal physical exam other than occasional cough. Patient is direct exposure to viral illness via her kindergartner. At this time, I feel the patient is appropriate for symptomatic treatment and does not need additional diagnostic testing at this time. Patient is counseled on the use of ibuprofen as well as over-the-counter acetaminophen containing decongestant. She is given Hycodan for nighttime cough.  New Prescriptions New Prescriptions   HYDROCODONE-HOMATROPINE (HYCODAN) 5-1.5 MG/5ML SYRUP    Take 5 mLs by mouth every 6 (six) hours as needed for cough.   IBUPROFEN (ADVIL,MOTRIN) 800 MG TABLET    Take 1 tablet (800 mg total) by mouth 3 (three) times daily.     Arby BarrettePfeiffer, Regla Fitzgibbon, MD 05/17/17 2232

## 2017-09-10 IMAGING — CT CT RENAL STONE PROTOCOL
2 of 4 series · 17 of 46 positions shown, 19 images · non-contrast
Comparison: None.

CLINICAL DATA: Left flank pain for 3-4 days.  Hematuria.

EXAM:
CT ABDOMEN AND PELVIS WITHOUT CONTRAST
TECHNIQUE: Multidetector CT imaging of the abdomen and pelvis was performed
following the standard protocol without IV contrast.

[Series 2: axial st · axial · 0.98mm/px · z∈[+385,+820]mm · 14 of 95 slices shown, 16 images]
[im 4/95  soft-tissue]
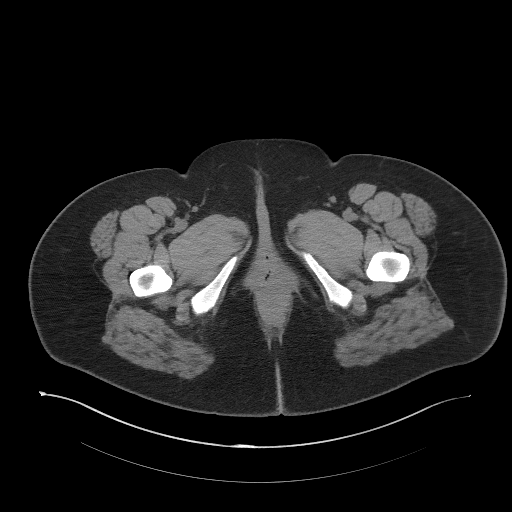
[im 4/95  bone]
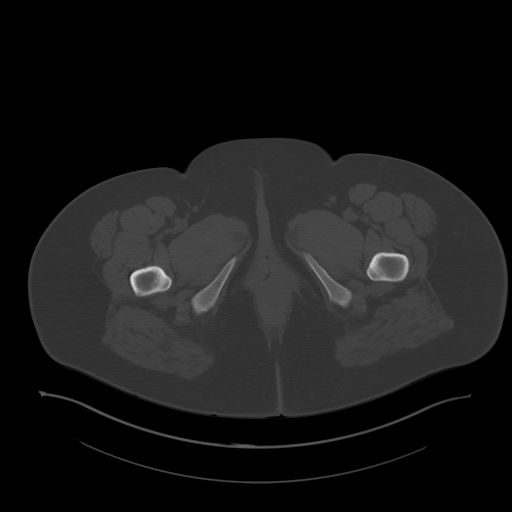
[im 12/95  soft-tissue]
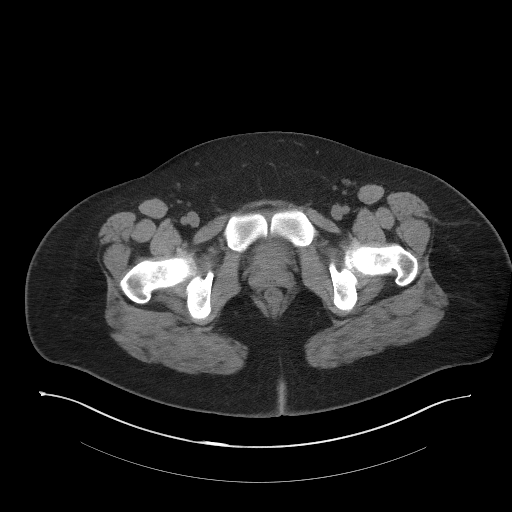
[im 19/95  soft-tissue]
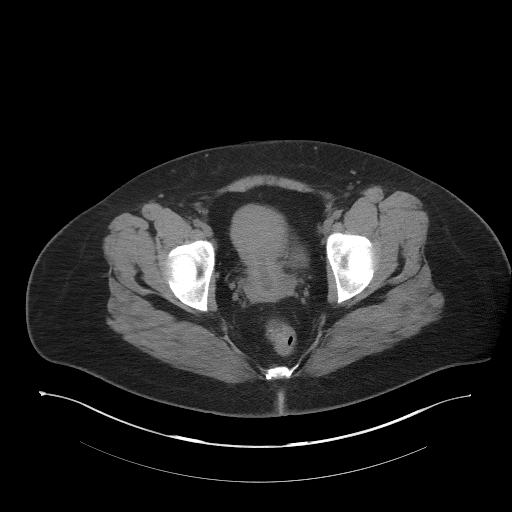
[im 27/95  soft-tissue]
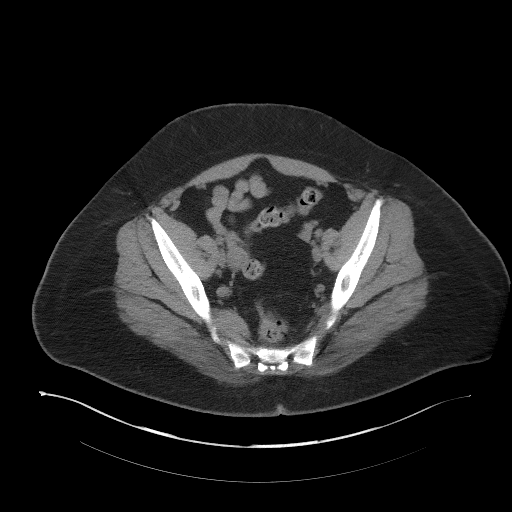
[im 31/95  soft-tissue]
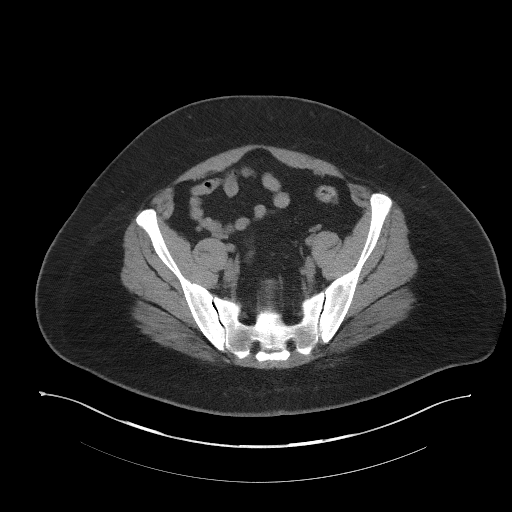
[im 38/95  soft-tissue]
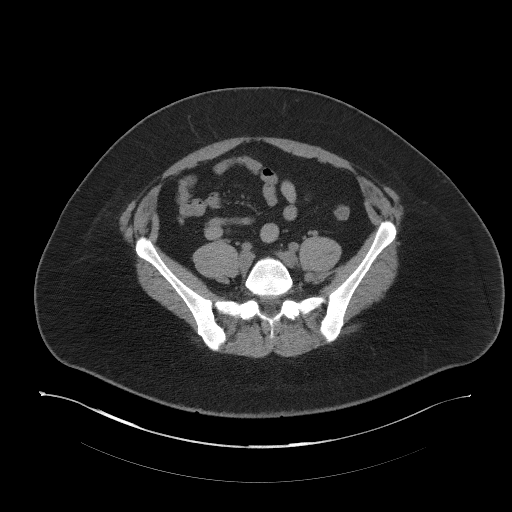
[im 46/95  soft-tissue]
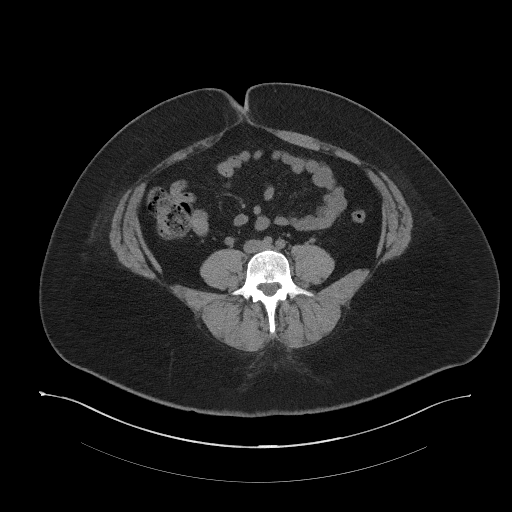
[im 49/95  soft-tissue]
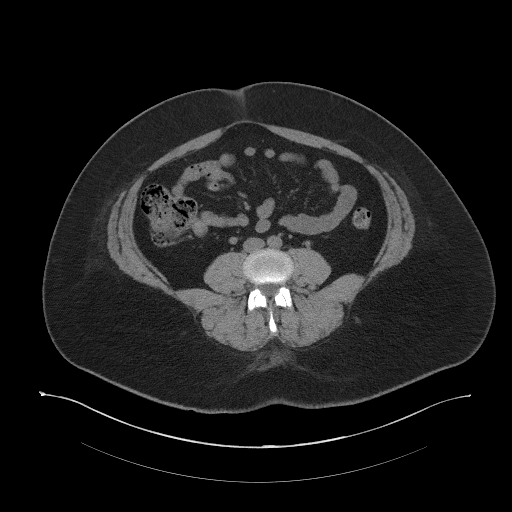
[im 57/95  soft-tissue]
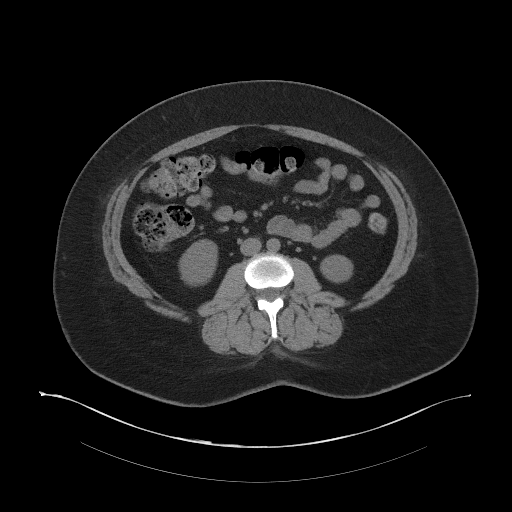
[im 57/95  bone]
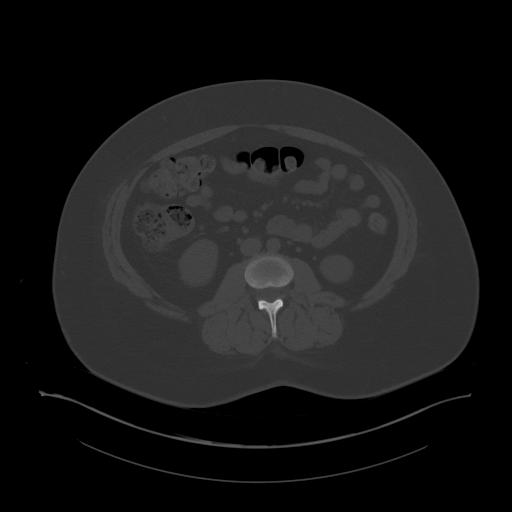
[im 64/95  soft-tissue]
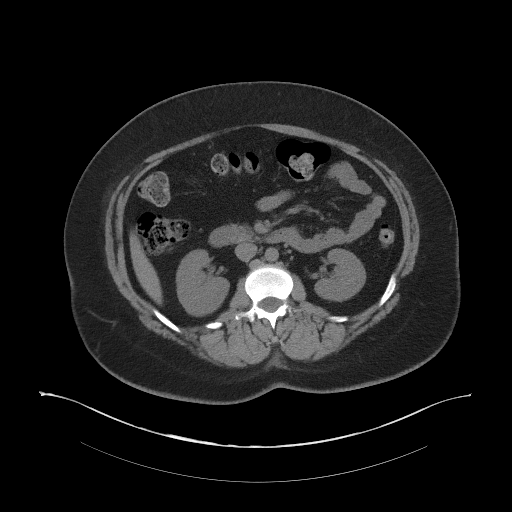
[im 72/95  soft-tissue]
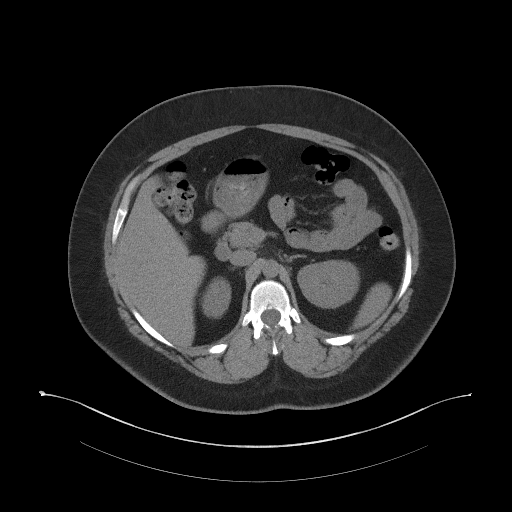
[im 76/95  soft-tissue]
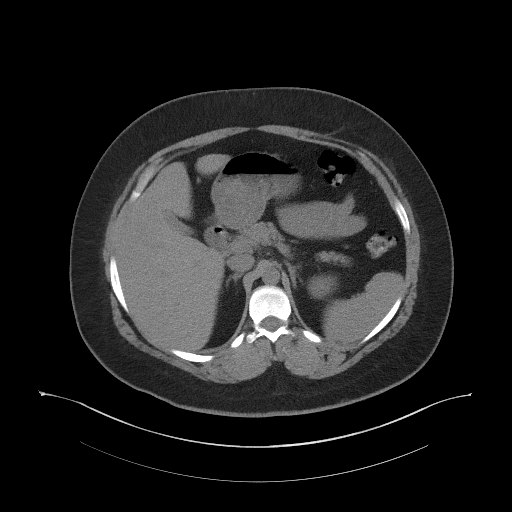
[im 83/95  soft-tissue]
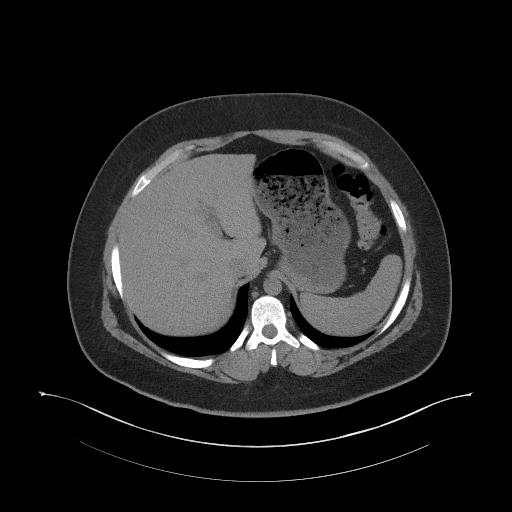
[im 91/95  soft-tissue]
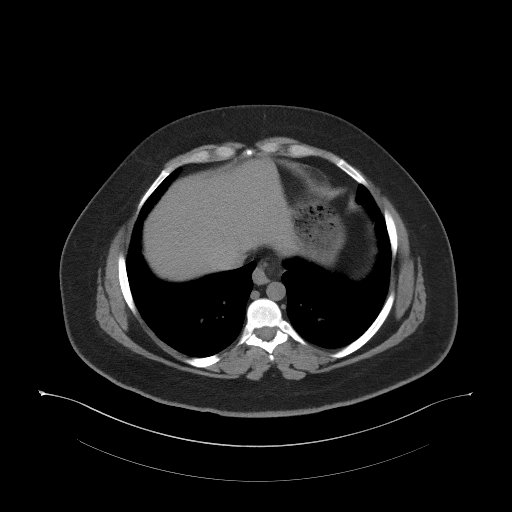

[Series 5: coronal st · coronal · 0.85mm/px · 3 of 102 slices shown]
[im 34/102  soft-tissue]
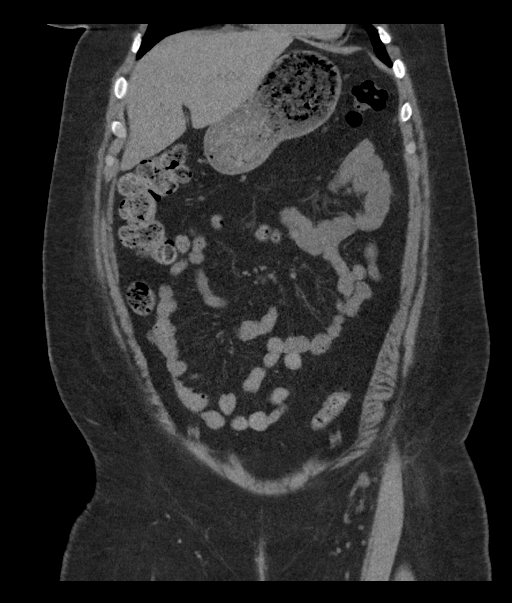
[im 45/102  soft-tissue]
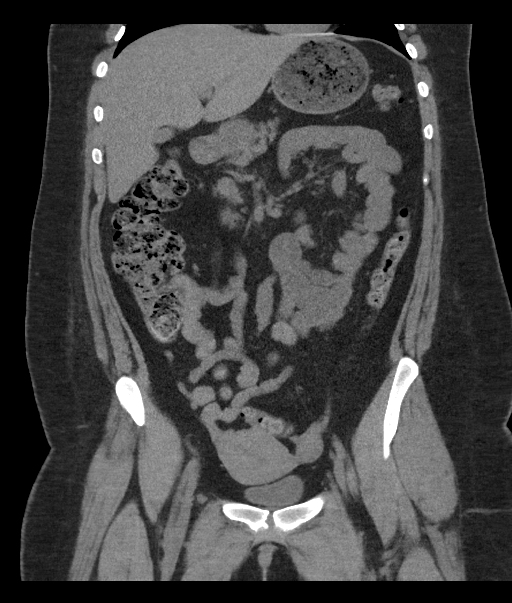
[im 57/102  soft-tissue]
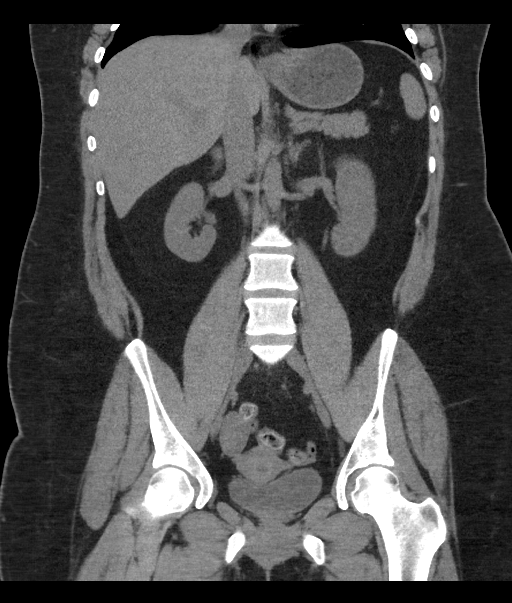

[17 of 46 positions shown; findings below may reference images not displayed]

FINDINGS: Lower chest: Lung bases are clear. No effusions. Heart is normal
size.

Hepatobiliary: No focal hepatic abnormality. Gallbladder
unremarkable. Gallbladder is contracted.

Pancreas: No focal abnormality or ductal dilatation.

Spleen: No focal abnormality.  Normal size.

Adrenals/Urinary Tract: No adrenal abnormality. No focal renal
abnormality. No stones or hydronephrosis. Urinary bladder is
unremarkable.

Stomach/Bowel: Small hiatal hernia. Stomach and small bowel
decompressed, grossly unremarkable. Large bowel unremarkable.
Appendix is normal.

Vascular/Lymphatic: No evidence of aneurysm or adenopathy.

Reproductive: Uterus and adnexa unremarkable.  No mass.

Other: No free fluid or free air.

Musculoskeletal: No acute bony abnormality or focal bone lesion.
IMPRESSION: Unremarkable noncontrast study. No renal or ureteral stones. No
hydronephrosis.

## 2017-10-09 ENCOUNTER — Other Ambulatory Visit: Payer: Self-pay | Admitting: Neurosurgery

## 2017-10-21 NOTE — Pre-Procedure Instructions (Signed)
Sevannah Ferdinand  10/21/2017      [id number] - [place], [place] - [address] [facility] [place] [address] Phone: [phone] Fax: [phone]    Your procedure is scheduled on Wednesday, Feb. 20th   Report to [facility] Admitting at  6:30 AM   Call this number if you have problems the morning of surgery:  [phone]   Remember:              4-5 days prior to surgery, STOP TAKING any Vitamins, Herbal Supplements, Anti-inflammatories, Blood Thinner.   Do not eat food or drink liquids after midnight, Tuesday.   Take these medicines the morning of surgery with A SIP OF WATER : Oxycodone   Do not wear jewelry, make-up or nail polish.  Do not wear lotions, powders,  perfumes, or deodorant.  Do not shave 48 hours prior to surgery.     Do not bring valuables to the hospital.  [facility] is not responsible for any belongings or valuables.  Contacts, dentures or bridgework may not be worn into surgery.  Leave your suitcase in the car.  After surgery it may be brought to your room.  For patients admitted to the hospital, discharge time will be determined by your treatment team.  Please read over the following fact sheets that you were given. Pain Booklet, MRSA Information and Surgical Site Infection Prevention      [facility]- Preparing For Surgery  Before surgery, you can play an important role. Because skin is not sterile, your skin needs to be as free of germs as possible. You can reduce the number of germs on your skin by washing with CHG (chlorahexidine gluconate) Soap before surgery.  CHG is an antiseptic cleaner which kills germs and bonds with the skin to continue killing germs even after washing.  Please do not use if you have an allergy to CHG or antibacterial soaps. If your skin becomes reddened/irritated stop using the CHG.  Do not shave (including legs and underarms) for at least 48 hours prior to first CHG shower. It is OK to  shave your face.  Please follow these instructions carefully.   1. Shower the NIGHT BEFORE SURGERY and the MORNING OF SURGERY with CHG.   2. If you chose to wash your hair, wash your hair first as usual with your normal shampoo.  3. After you shampoo, rinse your hair and body thoroughly to remove the shampoo.  4. Use CHG as you would any other liquid soap. You can apply CHG directly to the skin and wash gently with a scrungie or a clean washcloth.   5. Apply the CHG Soap to your body ONLY FROM THE NECK DOWN.  Do not use on open wounds or open sores. Avoid contact with your eyes, ears, mouth and genitals (private parts). Wash Face and genitals (private parts)  with your normal soap.  6. Wash thoroughly, paying special attention to the area where your surgery will be performed.  7. Thoroughly rinse your body with warm water from the neck down.  8. DO NOT shower/wash with your normal soap after using and rinsing off the CHG Soap.  9. Pat yourself dry with a CLEAN TOWEL.  10. Wear CLEAN PAJAMAS to bed the night before surgery, wear comfortable clothes the morning of surgery  11. Place CLEAN SHEETS on your bed the night of your first shower and DO NOT SLEEP WITH PETS.    Day of  Surgery: Do not apply any deodorants/lotions. Please wear clean clothes to the hospital/surgery center.

## 2017-10-22 ENCOUNTER — Other Ambulatory Visit: Payer: Self-pay

## 2017-10-22 ENCOUNTER — Encounter (HOSPITAL_COMMUNITY): Payer: Self-pay

## 2017-10-22 ENCOUNTER — Encounter (HOSPITAL_COMMUNITY)
Admission: RE | Admit: 2017-10-22 | Discharge: 2017-10-22 | Disposition: A | Discharge status: Home / Self Care | Payer: Medicaid Other | Source: Ambulatory Visit | Attending: Neurosurgery | Admitting: Neurosurgery

## 2017-10-22 DIAGNOSIS — Z01812 Encounter for preprocedural laboratory examination: Secondary | ICD-10-CM | POA: Insufficient documentation

## 2017-10-22 DIAGNOSIS — M5020 Other cervical disc displacement, unspecified cervical region: Secondary | ICD-10-CM | POA: Diagnosis not present

## 2017-10-22 HISTORY — DX: Other cervical disc displacement at C6-C7 level: M50.223

## 2017-10-22 LAB — SURGICAL PCR SCREEN
MRSA, PCR: NEGATIVE
STAPHYLOCOCCUS AUREUS: NEGATIVE

## 2017-10-22 LAB — CBC
HCT: 36.9 % (ref 36.0–46.0)
Hemoglobin: 11.6 g/dL — ABNORMAL LOW (ref 12.0–15.0)
MCH: 26 pg (ref 26.0–34.0)
MCHC: 31.4 g/dL (ref 30.0–36.0)
MCV: 82.7 fL (ref 78.0–100.0)
PLATELETS: 295 10*3/uL (ref 150–400)
RBC: 4.46 MIL/uL (ref 3.87–5.11)
RDW: 14.8 % (ref 11.5–15.5)
WBC: 5.3 10*3/uL (ref 4.0–10.5)

## 2017-10-22 LAB — HCG, SERUM, QUALITATIVE: PREG SERUM: NEGATIVE

## 2017-10-22 MED ORDER — CHLORHEXIDINE GLUCONATE CLOTH 2 % EX PADS: 6.0000 | MEDICATED_PAD | Freq: Once | CUTANEOUS | Status: DC

## 2017-10-22 NOTE — Progress Notes (Addendum)
PCP: pt denies, does have pain management MD, Dr[facility]  Cardiologist: pt denies  EKG: pt denies  Stress test: pt denies ever  ECHO: pt denies ever  Cardiac Cath: pt denies ever  Chest x-ray: pt denies past year, no recent respiratory complications/infections

## 2017-10-28 ENCOUNTER — Encounter (HOSPITAL_COMMUNITY): Payer: Self-pay | Admitting: Anesthesiology

## 2017-10-28 NOTE — Anesthesia Preprocedure Evaluation (Addendum)
Anesthesia Evaluation  Patient identified by MRN, date of birth, ID band Patient awake    Reviewed: Allergy & Precautions, NPO status , Patient's Chart, lab work & pertinent test results  Airway Mallampati: I       Dental no notable dental hx. (+) Teeth Intact   Pulmonary Current Smoker,    Pulmonary exam normal breath sounds clear to auscultation       Cardiovascular negative cardio ROS Normal cardiovascular exam Rhythm:Regular Rate:Normal     Neuro/Psych negative neurological ROS     GI/Hepatic negative GI ROS, Neg liver ROS,   Endo/Other  negative endocrine ROS  Renal/GU negative Renal ROS  negative genitourinary   Musculoskeletal negative musculoskeletal ROS (+)   Abdominal Normal abdominal exam  (+)   Peds  Hematology negative hematology ROS (+)   Anesthesia Other Findings   Reproductive/Obstetrics                            Anesthesia Physical Anesthesia Plan  ASA: II  Anesthesia Plan: General   Post-op Pain Management:    Induction: Intravenous  PONV Risk Score and Plan: 3 and Ondansetron, Dexamethasone, Scopolamine patch - Pre-op and Midazolam  Airway Management Planned: Oral ETT  Additional Equipment:   Intra-op Plan:   Post-operative Plan: Extubation in OR  Informed Consent: I have reviewed the patients History and Physical, chart, labs and discussed the procedure including the risks, benefits and alternatives for the proposed anesthesia with the patient or authorized representative who has indicated his/her understanding and acceptance.     Plan Discussed with: CRNA and Surgeon  Anesthesia Plan Comments:        Anesthesia Quick Evaluation

## 2017-10-29 ENCOUNTER — Ambulatory Visit (HOSPITAL_COMMUNITY): Payer: Medicaid Other

## 2017-10-29 ENCOUNTER — Ambulatory Visit (HOSPITAL_COMMUNITY)
Admission: RE | Admit: 2017-10-29 | Discharge: 2017-10-29 | Disposition: A | Discharge status: Home / Self Care | Payer: Medicaid Other | Source: Ambulatory Visit | Attending: Neurosurgery | Admitting: Neurosurgery

## 2017-10-29 ENCOUNTER — Ambulatory Visit (HOSPITAL_COMMUNITY): Payer: Medicaid Other | Admitting: Anesthesiology

## 2017-10-29 ENCOUNTER — Encounter (HOSPITAL_COMMUNITY): Payer: Self-pay

## 2017-10-29 ENCOUNTER — Encounter (HOSPITAL_COMMUNITY)
Admission: RE | Disposition: A | Discharge status: Home / Self Care | Payer: Self-pay | Source: Ambulatory Visit | Attending: Neurosurgery

## 2017-10-29 DIAGNOSIS — Z888 Allergy status to other drugs, medicaments and biological substances status: Secondary | ICD-10-CM | POA: Insufficient documentation

## 2017-10-29 DIAGNOSIS — Z8249 Family history of ischemic heart disease and other diseases of the circulatory system: Secondary | ICD-10-CM | POA: Insufficient documentation

## 2017-10-29 DIAGNOSIS — Z79891 Long term (current) use of opiate analgesic: Secondary | ICD-10-CM | POA: Insufficient documentation

## 2017-10-29 DIAGNOSIS — M5020 Other cervical disc displacement, unspecified cervical region: Secondary | ICD-10-CM | POA: Diagnosis present

## 2017-10-29 DIAGNOSIS — F17290 Nicotine dependence, other tobacco product, uncomplicated: Secondary | ICD-10-CM | POA: Insufficient documentation

## 2017-10-29 DIAGNOSIS — Z9102 Food additives allergy status: Secondary | ICD-10-CM | POA: Diagnosis not present

## 2017-10-29 DIAGNOSIS — Z419 Encounter for procedure for purposes other than remedying health state, unspecified: Secondary | ICD-10-CM

## 2017-10-29 DIAGNOSIS — M50223 Other cervical disc displacement at C6-C7 level: Secondary | ICD-10-CM | POA: Insufficient documentation

## 2017-10-29 HISTORY — PX: CERVICAL DISC ARTHROPLASTY: SHX587

## 2017-10-29 LAB — POCT PREGNANCY, URINE: Preg Test, Ur: NEGATIVE

## 2017-10-29 SURGERY — CERVICAL ANTERIOR DISC ARTHROPLASTY
Anesthesia: General | Site: Neck | Laterality: Right

## 2017-10-29 MED ORDER — ACETAMINOPHEN 325 MG PO TABS: 650.0000 mg | ORAL_TABLET | ORAL | Status: DC | PRN

## 2017-10-29 MED ORDER — POTASSIUM CHLORIDE IN NACL 20-0.9 MEQ/L-% IV SOLN: INTRAVENOUS | Status: DC

## 2017-10-29 MED ORDER — NEOSTIGMINE METHYLSULFATE 5 MG/5ML IV SOSY
PREFILLED_SYRINGE | INTRAVENOUS | Status: DC | PRN
  Administered 2017-10-29: 5 mg via INTRAVENOUS

## 2017-10-29 MED ORDER — ROCURONIUM BROMIDE 10 MG/ML (PF) SYRINGE
PREFILLED_SYRINGE | INTRAVENOUS | Status: DC | PRN
  Administered 2017-10-29 (×2): 10 mg via INTRAVENOUS
  Administered 2017-10-29: 5 mg via INTRAVENOUS
  Administered 2017-10-29: 40 mg via INTRAVENOUS

## 2017-10-29 MED ORDER — FOLIC ACID 0.5 MG HALF TAB: 500.0000 ug | ORAL_TABLET | Freq: Every day | ORAL | Status: DC

## 2017-10-29 MED ORDER — KETOROLAC TROMETHAMINE 30 MG/ML IJ SOLN
30.0000 mg | Freq: Once | INTRAMUSCULAR | Status: DC | PRN
  Administered 2017-10-29: 30 mg via INTRAVENOUS

## 2017-10-29 MED ORDER — LIDOCAINE 2% (20 MG/ML) 5 ML SYRINGE
INTRAMUSCULAR | Status: DC | PRN
  Administered 2017-10-29: 100 mg via INTRAVENOUS

## 2017-10-29 MED ORDER — BUSPIRONE HCL 10 MG PO TABS
10.0000 mg | ORAL_TABLET | Freq: Two times a day (BID) | ORAL | Status: DC
  Filled 2017-10-29 (×2): qty 1

## 2017-10-29 MED ORDER — TRAZODONE HCL 50 MG PO TABS
50.0000 mg | ORAL_TABLET | Freq: Every day | ORAL | Status: DC
  Filled 2017-10-29: qty 1

## 2017-10-29 MED ORDER — THROMBIN 5000 UNITS EX SOLR
CUTANEOUS | Status: DC | PRN
  Administered 2017-10-29 (×2): 5000 [IU] via TOPICAL

## 2017-10-29 MED ORDER — THROMBIN 5000 UNITS EX SOLR
CUTANEOUS | Status: AC
  Filled 2017-10-29: qty 10000

## 2017-10-29 MED ORDER — DEXAMETHASONE SODIUM PHOSPHATE 10 MG/ML IJ SOLN
INTRAMUSCULAR | Status: DC | PRN
  Administered 2017-10-29: 10 mg via INTRAVENOUS

## 2017-10-29 MED ORDER — LIDOCAINE-EPINEPHRINE 0.5 %-1:200000 IJ SOLN
INTRAMUSCULAR | Status: AC
  Filled 2017-10-29: qty 1

## 2017-10-29 MED ORDER — SODIUM CHLORIDE 0.9% FLUSH: 3.0000 mL | INTRAVENOUS | Status: DC | PRN

## 2017-10-29 MED ORDER — VITAMIN C 500 MG PO TABS
500.0000 mg | ORAL_TABLET | Freq: Every day | ORAL | Status: DC
  Administered 2017-10-29: 500 mg via ORAL
  Filled 2017-10-29: qty 1

## 2017-10-29 MED ORDER — LACTATED RINGERS IV SOLN
INTRAVENOUS | Status: DC | PRN
  Administered 2017-10-29 (×2): via INTRAVENOUS

## 2017-10-29 MED ORDER — ACETAMINOPHEN 650 MG RE SUPP: 650.0000 mg | RECTAL | Status: DC | PRN

## 2017-10-29 MED ORDER — OXYCODONE HCL 5 MG PO TABS: 5.0000 mg | ORAL_TABLET | Freq: Four times a day (QID) | ORAL | 0 refills | Status: AC | PRN

## 2017-10-29 MED ORDER — DIAZEPAM 5 MG PO TABS
ORAL_TABLET | ORAL | Status: AC
  Filled 2017-10-29: qty 1

## 2017-10-29 MED ORDER — ACETAMINOPHEN 500 MG PO TABS
1000.0000 mg | ORAL_TABLET | Freq: Four times a day (QID) | ORAL | Status: DC
  Administered 2017-10-29 (×2): 1000 mg via ORAL
  Filled 2017-10-29 (×2): qty 2

## 2017-10-29 MED ORDER — CELECOXIB 200 MG PO CAPS
ORAL_CAPSULE | ORAL | Status: AC
  Filled 2017-10-29: qty 2

## 2017-10-29 MED ORDER — LIDOCAINE 2% (20 MG/ML) 5 ML SYRINGE
INTRAMUSCULAR | Status: AC
  Filled 2017-10-29: qty 10

## 2017-10-29 MED ORDER — ACETAMINOPHEN 500 MG PO TABS
ORAL_TABLET | ORAL | Status: AC
  Filled 2017-10-29: qty 2

## 2017-10-29 MED ORDER — GLYCOPYRROLATE 0.2 MG/ML IV SOSY
PREFILLED_SYRINGE | INTRAVENOUS | Status: DC | PRN
  Administered 2017-10-29: .8 mg via INTRAVENOUS

## 2017-10-29 MED ORDER — DEXMEDETOMIDINE HCL IN NACL 200 MCG/50ML IV SOLN
INTRAVENOUS | Status: DC | PRN
  Administered 2017-10-29: 8 ug via INTRAVENOUS
  Administered 2017-10-29: 12 ug via INTRAVENOUS

## 2017-10-29 MED ORDER — HEMOSTATIC AGENTS (NO CHARGE) OPTIME
TOPICAL | Status: DC | PRN
  Administered 2017-10-29: 1 via TOPICAL

## 2017-10-29 MED ORDER — MEPERIDINE HCL 50 MG/ML IJ SOLN: 6.2500 mg | INTRAMUSCULAR | Status: DC | PRN

## 2017-10-29 MED ORDER — MENTHOL 3 MG MT LOZG
1.0000 | LOZENGE | OROMUCOSAL | Status: DC | PRN
Start: 2017-10-29 — End: 2017-10-29
  Administered 2017-10-29: 3 mg via ORAL
  Filled 2017-10-29: qty 9

## 2017-10-29 MED ORDER — DIAZEPAM 5 MG PO TABS
5.0000 mg | ORAL_TABLET | Freq: Four times a day (QID) | ORAL | Status: DC | PRN
  Administered 2017-10-29 (×2): 5 mg via ORAL
  Filled 2017-10-29: qty 1

## 2017-10-29 MED ORDER — PHENYLEPHRINE 40 MCG/ML (10ML) SYRINGE FOR IV PUSH (FOR BLOOD PRESSURE SUPPORT)
PREFILLED_SYRINGE | INTRAVENOUS | Status: DC | PRN
  Administered 2017-10-29: 80 ug via INTRAVENOUS

## 2017-10-29 MED ORDER — OXYCODONE HCL 5 MG PO TABS
10.0000 mg | ORAL_TABLET | ORAL | Status: DC | PRN
  Administered 2017-10-29 (×2): 10 mg via ORAL
  Filled 2017-10-29 (×2): qty 2

## 2017-10-29 MED ORDER — DEXAMETHASONE SODIUM PHOSPHATE 10 MG/ML IJ SOLN
INTRAMUSCULAR | Status: AC
  Filled 2017-10-29: qty 2

## 2017-10-29 MED ORDER — HYDROMORPHONE HCL 1 MG/ML IJ SOLN
INTRAMUSCULAR | Status: AC
  Administered 2017-10-29: 0.5 mg via INTRAVENOUS
  Filled 2017-10-29: qty 1

## 2017-10-29 MED ORDER — ONDANSETRON HCL 4 MG PO TABS: 4.0000 mg | ORAL_TABLET | Freq: Four times a day (QID) | ORAL | Status: DC | PRN

## 2017-10-29 MED ORDER — ONDANSETRON HCL 4 MG/2ML IJ SOLN
4.0000 mg | Freq: Four times a day (QID) | INTRAMUSCULAR | Status: DC | PRN
  Administered 2017-10-29: 4 mg via INTRAVENOUS
  Filled 2017-10-29: qty 2

## 2017-10-29 MED ORDER — SCOPOLAMINE 1 MG/3DAYS TD PT72
MEDICATED_PATCH | TRANSDERMAL | Status: AC
  Filled 2017-10-29: qty 1

## 2017-10-29 MED ORDER — PHENOL 1.4 % MT LIQD: 1.0000 | OROMUCOSAL | Status: DC | PRN

## 2017-10-29 MED ORDER — CELECOXIB 400 MG PO CAPS
400.0000 mg | ORAL_CAPSULE | Freq: Once | ORAL | Status: AC
  Administered 2017-10-29: 400 mg via ORAL
  Filled 2017-10-29: qty 1

## 2017-10-29 MED ORDER — PROPOFOL 10 MG/ML IV BOLUS
INTRAVENOUS | Status: AC
  Filled 2017-10-29: qty 20

## 2017-10-29 MED ORDER — OXYCODONE HCL 5 MG PO TABS: 5.0000 mg | ORAL_TABLET | ORAL | Status: DC | PRN

## 2017-10-29 MED ORDER — MIDAZOLAM HCL 5 MG/5ML IJ SOLN
INTRAMUSCULAR | Status: DC | PRN
  Administered 2017-10-29: 2 mg via INTRAVENOUS

## 2017-10-29 MED ORDER — SUCCINYLCHOLINE CHLORIDE 200 MG/10ML IV SOSY
PREFILLED_SYRINGE | INTRAVENOUS | Status: DC | PRN
  Administered 2017-10-29: 140 mg via INTRAVENOUS

## 2017-10-29 MED ORDER — ARTIFICIAL TEARS OPHTHALMIC OINT
TOPICAL_OINTMENT | OPHTHALMIC | Status: AC
  Filled 2017-10-29: qty 3.5

## 2017-10-29 MED ORDER — ONDANSETRON HCL 4 MG/2ML IJ SOLN
INTRAMUSCULAR | Status: AC
  Filled 2017-10-29: qty 2

## 2017-10-29 MED ORDER — ONDANSETRON HCL 4 MG/2ML IJ SOLN
INTRAMUSCULAR | Status: DC | PRN
  Administered 2017-10-29 (×2): 4 mg via INTRAVENOUS

## 2017-10-29 MED ORDER — KETOROLAC TROMETHAMINE 30 MG/ML IJ SOLN
INTRAMUSCULAR | Status: AC
  Administered 2017-10-29: 30 mg via INTRAVENOUS
  Filled 2017-10-29: qty 1

## 2017-10-29 MED ORDER — 0.9 % SODIUM CHLORIDE (POUR BTL) OPTIME
TOPICAL | Status: DC | PRN
  Administered 2017-10-29: 1000 mL

## 2017-10-29 MED ORDER — OXYCODONE HCL ER 10 MG PO T12A
10.0000 mg | EXTENDED_RELEASE_TABLET | Freq: Two times a day (BID) | ORAL | Status: DC
  Administered 2017-10-29: 10 mg via ORAL
  Filled 2017-10-29: qty 1

## 2017-10-29 MED ORDER — PHENYLEPHRINE 40 MCG/ML (10ML) SYRINGE FOR IV PUSH (FOR BLOOD PRESSURE SUPPORT)
PREFILLED_SYRINGE | INTRAVENOUS | Status: AC
  Filled 2017-10-29: qty 10

## 2017-10-29 MED ORDER — MORPHINE SULFATE (PF) 4 MG/ML IV SOLN: 2.0000 mg | INTRAVENOUS | Status: DC | PRN

## 2017-10-29 MED ORDER — GABAPENTIN 300 MG PO CAPS
300.0000 mg | ORAL_CAPSULE | Freq: Three times a day (TID) | ORAL | Status: DC
  Administered 2017-10-29: 300 mg via ORAL
  Filled 2017-10-29: qty 1

## 2017-10-29 MED ORDER — SODIUM CHLORIDE 0.9% FLUSH: 3.0000 mL | Freq: Two times a day (BID) | INTRAVENOUS | Status: DC

## 2017-10-29 MED ORDER — FENTANYL CITRATE (PF) 250 MCG/5ML IJ SOLN
INTRAMUSCULAR | Status: AC
  Filled 2017-10-29: qty 5

## 2017-10-29 MED ORDER — FENTANYL CITRATE (PF) 250 MCG/5ML IJ SOLN
INTRAMUSCULAR | Status: DC | PRN
  Administered 2017-10-29: 25 ug via INTRAVENOUS
  Administered 2017-10-29: 50 ug via INTRAVENOUS
  Administered 2017-10-29: 125 ug via INTRAVENOUS
  Administered 2017-10-29 (×2): 25 ug via INTRAVENOUS

## 2017-10-29 MED ORDER — ACETAMINOPHEN 325 MG PO TABS
ORAL_TABLET | ORAL | Status: DC | PRN
  Administered 2017-10-29: 1000 mg via ORAL

## 2017-10-29 MED ORDER — PHENYLEPHRINE HCL 10 MG/ML IJ SOLN
INTRAMUSCULAR | Status: DC | PRN
  Administered 2017-10-29: 25 ug/min via INTRAVENOUS

## 2017-10-29 MED ORDER — PROMETHAZINE HCL 25 MG/ML IJ SOLN: 6.2500 mg | INTRAMUSCULAR | Status: DC | PRN

## 2017-10-29 MED ORDER — MIDAZOLAM HCL 2 MG/2ML IJ SOLN
INTRAMUSCULAR | Status: AC
  Filled 2017-10-29: qty 2

## 2017-10-29 MED ORDER — SODIUM CHLORIDE 0.9 % IV SOLN: 250.0000 mL | INTRAVENOUS | Status: DC

## 2017-10-29 MED ORDER — TIZANIDINE HCL 4 MG PO TABS: 4.0000 mg | ORAL_TABLET | Freq: Four times a day (QID) | ORAL | 0 refills | Status: AC | PRN

## 2017-10-29 MED ORDER — CEFAZOLIN SODIUM-DEXTROSE 2-4 GM/100ML-% IV SOLN
2.0000 g | INTRAVENOUS | Status: AC
  Administered 2017-10-29: 2 g via INTRAVENOUS
  Filled 2017-10-29: qty 100

## 2017-10-29 MED ORDER — PROPOFOL 10 MG/ML IV BOLUS
INTRAVENOUS | Status: DC | PRN
  Administered 2017-10-29: 200 mg via INTRAVENOUS

## 2017-10-29 MED ORDER — HYDROMORPHONE HCL 1 MG/ML IJ SOLN
0.2500 mg | INTRAMUSCULAR | Status: DC | PRN
  Administered 2017-10-29 (×4): 0.5 mg via INTRAVENOUS

## 2017-10-29 MED ORDER — SCOPOLAMINE 1 MG/3DAYS TD PT72
MEDICATED_PATCH | TRANSDERMAL | Status: DC | PRN
  Administered 2017-10-29: 1 via TRANSDERMAL

## 2017-10-29 MED ORDER — LIDOCAINE-EPINEPHRINE 0.5 %-1:200000 IJ SOLN
INTRAMUSCULAR | Status: DC | PRN
  Administered 2017-10-29: 7 mL

## 2017-10-29 MED ORDER — ONDANSETRON HCL 4 MG/2ML IJ SOLN
INTRAMUSCULAR | Status: AC
  Filled 2017-10-29: qty 4

## 2017-10-29 SURGICAL SUPPLY — 69 items
BIT DRILL FLUTELESS (BIT) ×2 IMPLANT
BLADE CLIPPER SURG (BLADE) IMPLANT
BNDG GAUZE ELAST 4 BULKY (GAUZE/BANDAGES/DRESSINGS) ×4 IMPLANT
BUR DRUM 4.0 (BURR) ×2 IMPLANT
BUR MATCHSTICK NEURO 3.0 LAGG (BURR) ×2 IMPLANT
CAGE PRESTIGE 6X14 (Cage) ×2 IMPLANT
CANISTER SUCT 3000ML PPV (MISCELLANEOUS) ×2 IMPLANT
CARTRIDGE OIL MAESTRO DRILL (MISCELLANEOUS) ×1 IMPLANT
DECANTER SPIKE VIAL GLASS SM (MISCELLANEOUS) ×2 IMPLANT
DERMABOND ADVANCED (GAUZE/BANDAGES/DRESSINGS) ×1
DERMABOND ADVANCED .7 DNX12 (GAUZE/BANDAGES/DRESSINGS) ×1 IMPLANT
DIFFUSER DRILL AIR PNEUMATIC (MISCELLANEOUS) ×2 IMPLANT
DRAPE C-ARM 42X72 X-RAY (DRAPES) ×2 IMPLANT
DRAPE C-ARMOR (DRAPES) ×2 IMPLANT
DRAPE HALF SHEET 40X57 (DRAPES) ×2 IMPLANT
DRAPE LAPAROTOMY 100X72 PEDS (DRAPES) ×2 IMPLANT
DRAPE MICROSCOPE LEICA (MISCELLANEOUS) ×2 IMPLANT
DRAPE POUCH INSTRU U-SHP 10X18 (DRAPES) ×2 IMPLANT
DURAPREP 6ML APPLICATOR 50/CS (WOUND CARE) ×2 IMPLANT
ELECT COATED BLADE 2.86 ST (ELECTRODE) ×2 IMPLANT
ELECT REM PT RETURN 9FT ADLT (ELECTROSURGICAL) ×2
ELECTRODE REM PT RTRN 9FT ADLT (ELECTROSURGICAL) ×1 IMPLANT
GAUZE SPONGE 4X4 16PLY XRAY LF (GAUZE/BANDAGES/DRESSINGS) IMPLANT
GLOVE BIO SURGEON STRL SZ 6.5 (GLOVE) IMPLANT
GLOVE BIO SURGEON STRL SZ7 (GLOVE) IMPLANT
GLOVE BIO SURGEON STRL SZ7.5 (GLOVE) IMPLANT
GLOVE BIO SURGEON STRL SZ8 (GLOVE) ×2 IMPLANT
GLOVE BIO SURGEON STRL SZ8.5 (GLOVE) IMPLANT
GLOVE BIOGEL M 8.0 STRL (GLOVE) IMPLANT
GLOVE BIOGEL PI IND STRL 7.0 (GLOVE) ×6 IMPLANT
GLOVE BIOGEL PI INDICATOR 7.0 (GLOVE) ×6
GLOVE ECLIPSE 6.5 STRL STRAW (GLOVE) ×2 IMPLANT
GLOVE ECLIPSE 7.0 STRL STRAW (GLOVE) IMPLANT
GLOVE ECLIPSE 7.5 STRL STRAW (GLOVE) IMPLANT
GLOVE ECLIPSE 8.0 STRL XLNG CF (GLOVE) IMPLANT
GLOVE ECLIPSE 8.5 STRL (GLOVE) IMPLANT
GLOVE EXAM NITRILE LRG STRL (GLOVE) IMPLANT
GLOVE EXAM NITRILE XL STR (GLOVE) IMPLANT
GLOVE EXAM NITRILE XS STR PU (GLOVE) IMPLANT
GLOVE INDICATOR 6.5 STRL GRN (GLOVE) IMPLANT
GLOVE INDICATOR 7.0 STRL GRN (GLOVE) IMPLANT
GLOVE INDICATOR 7.5 STRL GRN (GLOVE) IMPLANT
GLOVE INDICATOR 8.0 STRL GRN (GLOVE) IMPLANT
GLOVE INDICATOR 8.5 STRL (GLOVE) ×2 IMPLANT
GLOVE OPTIFIT SS 8.0 STRL (GLOVE) IMPLANT
GLOVE SURG SS PI 6.5 STRL IVOR (GLOVE) IMPLANT
GOWN STRL REUS W/ TWL LRG LVL3 (GOWN DISPOSABLE) ×1 IMPLANT
GOWN STRL REUS W/ TWL XL LVL3 (GOWN DISPOSABLE) ×3 IMPLANT
GOWN STRL REUS W/TWL 2XL LVL3 (GOWN DISPOSABLE) IMPLANT
GOWN STRL REUS W/TWL LRG LVL3 (GOWN DISPOSABLE) ×1
GOWN STRL REUS W/TWL XL LVL3 (GOWN DISPOSABLE) ×3
KIT BASIN OR (CUSTOM PROCEDURE TRAY) ×2 IMPLANT
KIT ROOM TURNOVER OR (KITS) ×2 IMPLANT
NEEDLE HYPO 25X1 1.5 SAFETY (NEEDLE) ×2 IMPLANT
NEEDLE SPNL 22GX3.5 QUINCKE BK (NEEDLE) ×4 IMPLANT
NS IRRIG 1000ML POUR BTL (IV SOLUTION) ×2 IMPLANT
OIL CARTRIDGE MAESTRO DRILL (MISCELLANEOUS) ×2
PACK LAMINECTOMY NEURO (CUSTOM PROCEDURE TRAY) ×2 IMPLANT
PAD ARMBOARD 7.5X6 YLW CONV (MISCELLANEOUS) ×4 IMPLANT
PIN DISTRACTION 14MM (PIN) ×4 IMPLANT
RUBBERBAND STERILE (MISCELLANEOUS) ×4 IMPLANT
SPONGE INTESTINAL PEANUT (DISPOSABLE) ×2 IMPLANT
SPONGE SURGIFOAM ABS GEL SZ50 (HEMOSTASIS) ×2 IMPLANT
SUT VIC AB 0 CT1 27 (SUTURE) ×1
SUT VIC AB 0 CT1 27XBRD ANTBC (SUTURE) ×1 IMPLANT
SUT VIC AB 3-0 SH 8-18 (SUTURE) ×4 IMPLANT
TOWEL GREEN STERILE (TOWEL DISPOSABLE) ×2 IMPLANT
TOWEL GREEN STERILE FF (TOWEL DISPOSABLE) ×2 IMPLANT
WATER STERILE IRR 1000ML POUR (IV SOLUTION) ×2 IMPLANT

## 2017-10-29 NOTE — Progress Notes (Signed)
Pt doing well. Pt and husband given D/C instructions with Rx's, verbal understanding was provided. Pt's incision is clean and dry with no sign of infection. Pt's IV was removed prior to D/C. Pt D/C'd home via wheelchair @ 1845 per MD order. Pt is stable @ D/C and has no other needs at this time. Manpreet Strey, RN  

## 2017-10-29 NOTE — Discharge Instructions (Signed)

## 2017-10-29 NOTE — Discharge Summary (Signed)
Physician Discharge Summary  Patient ID: Joann Hunt MRN: [id number] DOB/AGE: 27/03/1991 27 y.o.  Admit date: 10/29/2017 Discharge date: 10/29/2017  Admission Diagnoses:HNP CervicaL c6/7  Discharge Diagnoses: same Active Problems:   HNP (herniated nucleus pulposus), cervical   Discharged Condition: good  Hospital Course: Mrs. Maruyama was admitted and taken to the operating room for an uncomplicated cervical arthroplasty at C6/7, using the medtronic Prodisc C. Post op she is voiding, ambulating, and tolerating a regular diet.  Her wound is clean, dry, and without signs of infection. Her speaking voice is strong. She is moving all extremities well.  Treatments: surgery: Anterior cervical decompression C6/7 Arthroplasty Cervical C6/7, medtronic prodisc 6x6814mm    Discharge Exam: Blood pressure (!) 142/83, pulse (!) 110, temperature 98.1 F (36.7 C), resp. rate 16, height 5' 7.5" (1.715 m), weight 105.9 kg (233 lb 6.4 oz), last menstrual period 10/08/2017, SpO2 99 %. General appearance: alert, cooperative and appears stated age  Disposition: 01-Home or Self Care HERNIATED NUCLEUS PULPOSUS, CERVICAL 6- CERVICAL 7  Allergies as of 10/29/2017      Reactions   Pepto-bismol [bismuth] Hives   Red Dye Hives      Medication List    TAKE these medications   acetaminophen 500 MG tablet Commonly known as:  TYLENOL Take 1,000 mg by mouth every 6 (six) hours as needed for moderate pain or headache.   busPIRone 10 MG tablet Commonly known as:  BUSPAR Take 10 mg by mouth 2 (two) times daily.   folic acid 400 MCG tablet Commonly known as:  FOLVITE Take 400 mcg by mouth daily.   ibuprofen 800 MG tablet Commonly known as:  ADVIL,MOTRIN Take 1 tablet (800 mg total) by mouth 3 (three) times daily.   MULTIVITAMIN PO Take 1 tablet by mouth daily.   ondansetron 4 MG disintegrating tablet Commonly known as:  ZOFRAN ODT 4mg  ODT q6 hours prn nausea/vomit   Oxycodone HCl 10 MG  Tabs Take 10 mg by mouth every 4 (four) hours as needed (for pain). What changed:  Another medication with the same name was added. Make sure you understand how and when to take each.   oxyCODONE 5 MG immediate release tablet Commonly known as:  ROXICODONE Take 1 tablet (5 mg total) by mouth every 6 (six) hours as needed for severe pain. What changed:  You were already taking a medication with the same name, and this prescription was added. Make sure you understand how and when to take each.   tiZANidine 4 MG tablet Commonly known as:  ZANAFLEX Take 1 tablet (4 mg total) by mouth every 6 (six) hours as needed for muscle spasms.   traZODone 50 MG tablet Commonly known as:  DESYREL Take 50 mg by mouth at bedtime.   vitamin C 500 MG tablet Commonly known as:  ASCORBIC ACID Take 500 mg by mouth daily.      Follow-up Information    Coletta Memosabbell, Kijuan Gallicchio, MD Follow up in 3 week(s).   Specialty:  Neurosurgery Why:  please call to make an appointment Contact information: [address] [place] [address] [phone]           Signed: Carmela HurtCABBELL,Andree Golphin L 10/29/2017, 6:06 PM

## 2017-10-29 NOTE — Op Note (Signed)
10/29/2017  10:47 AM  PATIENT:  Daphney Brunkow  27 y.o. female  PRE-OPERATIVE DIAGNOSIS:  HERNIATED NUCLEUS PULPOSUS, CERVICAL 6- CERVICAL 7  POST-OPERATIVE DIAGNOSIS:  HERNIATED NUCLEUS PULPOSUS, CERVICAL 6- CERVICAL 7  PROCEDURE:  Anterior cervical decompression C6/7 Arthroplasty Cervical C6/7, medtronic prodisc 6x3214mm  SURGEON:   Surgeon(s): Coletta Memosabbell, Jasmia Angst, MD Donalee Citrinram, Gary, MD   ASSISTANTS:Cram, Jillyn HiddenGary  ANESTHESIA:   general  EBL:  Total I/O In: 1000 [I.V.:1000] Out: 75 [Blood:75]  BLOOD ADMINISTERED:none  CELL SAVER GIVEN:none  COUNT:per nursing  DRAINS: none   SPECIMEN:  No Specimen  DICTATION: Mrs. Bulow was taken to the operating room, intubated, and placed under general anesthesia without difficulty. She was positioned supine with her head in slight extension on the bed. The neck was prepped and draped in a sterile manner. I infiltrated 6 cc's 1/2%lidocaine/1:200,000 strength epinephrine into the planned incision starting from the midline to the medial border of the left sternocleidomastoid muscle. I opened the incision with a 10 blade and dissected sharply through soft tissue to the platysma. I dissected in the plane superior to the platysma both rostrally and caudally. I then opened the platysma in a horizontal fashion with Metzenbaum scissors, and dissected in the inferior plane rostrally and caudally. With both blunt and sharp technique I created an avascular corridor to the cervical spine. I placed a spinal needle(s) in the disc space at C5/6 . I then reflected the longus colli from C6 to C7 and placed self retaining retractors. I opened the disc space(s) at C6/7 with a 15 blade. I removed disc with curettes, Kerrison punches, and the drill. Using the drill I removed osteophytes and prepared for the decompression.  I decompressed the spinal canal and the C7 root(s) with the drill, Kerrison punches, and the curettes. I used the microscope to aid in microdissection. I  removed the posterior longitudinal ligament to fully expose and decompress the thecal sac. I exposed the roots laterally taking down the C6/7 uncovertebral joints. With the decompression complete I moved on to the arthroplasty. We used the drill to level the surfaces of C6,and C7. I removed soft tissue to prepare the disc space and the bony surfaces. I measured the space and placed a 6x1214mm prodisc artificial disc into the disc space.   Intraoperative xray showed the artificial disc to be in good position. I irrigated the wound, achieved hemostasis, and closed the wound in layers. I approximated the platysma, and the subcuticular plane with vicryl sutures. I used Dermabond for a sterile dressing.   PLAN OF CARE: Admit for overnight observation  PATIENT DISPOSITION:  PACU - hemodynamically stable.   Delay start of Pharmacological VTE agent (>24hrs) due to surgical blood loss or risk of bleeding:  yes

## 2017-10-29 NOTE — Anesthesia Postprocedure Evaluation (Signed)
Anesthesia Post Note  Patient: Joann Hunt  Procedure(s) Performed: ARTIFICIAL DISC REPLACEMENT, CERVICAL SIX- CERVICAL SEVEN (Right Neck)     Patient location during evaluation: PACU Anesthesia Type: General Level of consciousness: awake Pain management: pain level controlled Vital Signs Assessment: post-procedure vital signs reviewed and stable Respiratory status: spontaneous breathing Cardiovascular status: stable Postop Assessment: no apparent nausea or vomiting Anesthetic complications: no    Last Vitals:  Vitals:   10/29/17 1132 10/29/17 1205  BP:  (!) 129/96  Pulse:  87  Resp:  16  Temp: 36.4 C 36.8 C  SpO2:  98%    Last Pain:  Vitals:   10/29/17 1200  TempSrc:   PainSc: 8    Pain Goal: Patients Stated Pain Goal: 4 (10/29/17 1200)               Joann Hunt,Joann Hunt
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
## 2017-10-29 NOTE — Transfer of Care (Signed)
Immediate Anesthesia Transfer of Care Note  Patient: Joann Hunt  Procedure(s) Performed: ARTIFICIAL DISC REPLACEMENT, CERVICAL SIX- CERVICAL SEVEN (Right Neck)  Patient Location: PACU  Anesthesia Type:General  Level of Consciousness: drowsy and patient cooperative  Airway & Oxygen Therapy: Patient Spontanous Breathing and Patient connected to nasal cannula oxygen  Post-op Assessment: Report given to RN and Post -op Vital signs reviewed and stable  Post vital signs: Reviewed and stable  Last Vitals:  Vitals:   10/29/17 0623  BP: 123/79  Pulse: 88  Resp: 19  Temp: 36.7 C  SpO2: 100%    Last Pain:  Vitals:   10/29/17 0649  TempSrc:   PainSc: 8       Patients Stated Pain Goal: 4 (10/29/17 0649)  Complications: No apparent anesthesia complications

## 2017-10-29 NOTE — Progress Notes (Signed)
Orthopedic Tech Progress Note Patient Details:  Victorino SparrowMelondy Mick 06/18/1991 [id number]  Ortho Devices Type of Ortho Device: Soft collar Ortho Device/Splint Location: neck  Ortho Device/Splint Interventions: Ordered, Application   Post Interventions Patient Tolerated: Well Instructions Provided: Care of device   Jennye MoccasinHughes, Novalynn Branaman Craig 10/29/2017, 3:09 PM

## 2017-10-29 NOTE — Anesthesia Procedure Notes (Signed)
Procedure Name: Intubation Date/Time: 10/29/2017 8:51 AM Performed by: Elliot DallyHuggins, Shakila Mak, CRNA Pre-anesthesia Checklist: Patient identified, Emergency Drugs available, Suction available and Patient being monitored Patient Re-evaluated:Patient Re-evaluated prior to induction Oxygen Delivery Method: Circle System Utilized Preoxygenation: Pre-oxygenation with 100% oxygen Induction Type: IV induction, Rapid sequence and Cricoid Pressure applied Laryngoscope Size: Miller and 2 Grade View: Grade I Tube type: Oral Tube size: 7.0 mm Number of attempts: 1 Airway Equipment and Method: Stylet (ramped with two blankets under shoulders) Placement Confirmation: ETT inserted through vocal cords under direct vision,  positive ETCO2 and breath sounds checked- equal and bilateral Secured at: 24 cm Tube secured with: Tape Dental Injury: Teeth and Oropharynx as per pre-operative assessment

## 2017-10-31 ENCOUNTER — Encounter (HOSPITAL_COMMUNITY): Payer: Self-pay | Admitting: Neurosurgery

## 2018-02-02 ENCOUNTER — Other Ambulatory Visit: Payer: Self-pay

## 2018-02-02 ENCOUNTER — Emergency Department (HOSPITAL_BASED_OUTPATIENT_CLINIC_OR_DEPARTMENT_OTHER)
Admission: EM | Admit: 2018-02-02 | Discharge: 2018-02-02 | Disposition: A | Discharge status: Home / Self Care | Payer: Medicaid Other | Attending: Emergency Medicine | Admitting: Emergency Medicine

## 2018-02-02 ENCOUNTER — Encounter (HOSPITAL_BASED_OUTPATIENT_CLINIC_OR_DEPARTMENT_OTHER): Payer: Self-pay | Admitting: Emergency Medicine

## 2018-02-02 DIAGNOSIS — Y939 Activity, unspecified: Secondary | ICD-10-CM | POA: Insufficient documentation

## 2018-02-02 DIAGNOSIS — S81811A Laceration without foreign body, right lower leg, initial encounter: Secondary | ICD-10-CM | POA: Diagnosis not present

## 2018-02-02 DIAGNOSIS — W2203XA Walked into furniture, initial encounter: Secondary | ICD-10-CM | POA: Insufficient documentation

## 2018-02-02 DIAGNOSIS — Z23 Encounter for immunization: Secondary | ICD-10-CM | POA: Insufficient documentation

## 2018-02-02 DIAGNOSIS — Z79899 Other long term (current) drug therapy: Secondary | ICD-10-CM | POA: Diagnosis not present

## 2018-02-02 DIAGNOSIS — Y929 Unspecified place or not applicable: Secondary | ICD-10-CM | POA: Insufficient documentation

## 2018-02-02 DIAGNOSIS — Y999 Unspecified external cause status: Secondary | ICD-10-CM | POA: Diagnosis not present

## 2018-02-02 DIAGNOSIS — F17210 Nicotine dependence, cigarettes, uncomplicated: Secondary | ICD-10-CM | POA: Insufficient documentation

## 2018-02-02 MED ORDER — TETANUS-DIPHTH-ACELL PERTUSSIS 5-2.5-18.5 LF-MCG/0.5 IM SUSP
0.5000 mL | Freq: Once | INTRAMUSCULAR | Status: AC
  Administered 2018-02-02: 0.5 mL via INTRAMUSCULAR
  Filled 2018-02-02: qty 0.5

## 2018-02-02 MED ORDER — LIDOCAINE-EPINEPHRINE 2 %-1:100000 IJ SOLN: 20.0000 mL | Freq: Once | INTRAMUSCULAR | Status: DC

## 2018-02-02 MED ORDER — IBUPROFEN 400 MG PO TABS
400.0000 mg | ORAL_TABLET | Freq: Once | ORAL | Status: AC
  Administered 2018-02-02: 400 mg via ORAL
  Filled 2018-02-02: qty 1

## 2018-02-02 MED ORDER — LIDOCAINE-EPINEPHRINE 1 %-1:100000 IJ SOLN
INTRAMUSCULAR | Status: AC
  Administered 2018-02-02: 1 mL
  Filled 2018-02-02: qty 1

## 2018-02-02 NOTE — Discharge Instructions (Addendum)
Seen today and have stitches to your right lower leg.  Keep clean and dry.  Follow-up in 10 to 12 days for suture removal.

## 2018-02-02 NOTE — ED Triage Notes (Signed)
Pt presents with c/o laceration to right lower leg that she cut on her bed frame. Laceration to right lower leg 2 inches

## 2018-02-02 NOTE — ED Notes (Signed)
ED Provider at bedside. 

## 2018-02-02 NOTE — ED Provider Notes (Signed)
[facility] EMERGENCY DEPARTMENT Provider Note   CSN: [id number] Arrival date & time: 02/02/18  0100     History   Chief Complaint Chief Complaint  Patient presents with  . Laceration    HPI Joann Hunt is a 27 y.o. female.  HPI  This is a 27 year old female who presents with a laceration to the right lower extremity.  Patient reports that her right leg on the end of the bed.  She sustained a laceration.  She is rating 8 out of 10 pain.  It is worse with walking.  Last tetanus was greater than 5 years ago.  Denies any numbness or tingling.  Denies any other injury.  Past Medical History:  Diagnosis Date  . Anxiety   . Depression   . Herniated nucleus pulposus, C6-7     Patient Active Problem List   Diagnosis Date Noted  . HNP (herniated nucleus pulposus), cervical 10/29/2017    Past Surgical History:  Procedure Laterality Date  . CERVICAL DISC ARTHROPLASTY Right 10/29/2017   Procedure: ARTIFICIAL DISC REPLACEMENT, CERVICAL SIX- CERVICAL SEVEN;  Surgeon: Coletta Memos, MD;  Location: [facility] OR;  Service: Neurosurgery;  Laterality: Right;  . FOOT SURGERY       OB History   None      Home Medications    Prior to Admission medications   Medication Sig Start Date End Date Taking? Authorizing Provider  acetaminophen (TYLENOL) 500 MG tablet Take 1,000 mg by mouth every 6 (six) hours as needed for moderate pain or headache.    [provider]  busPIRone (BUSPAR) 10 MG tablet Take 10 mg by mouth 2 (two) times daily.    [provider]  folic acid (FOLVITE) 400 MCG tablet Take 400 mcg by mouth daily.    [provider]  ibuprofen (ADVIL,MOTRIN) 800 MG tablet Take 1 tablet (800 mg total) by mouth 3 (three) times daily. Patient not taking: Reported on 10/16/2017 05/17/17   Arby Barrette, MD  Multiple Vitamins-Minerals (MULTIVITAMIN PO) Take 1 tablet by mouth daily.    [provider]  ondansetron (ZOFRAN ODT) 4 MG disintegrating  tablet  ODT q6 hours prn nausea/vomit Patient not taking: Reported on 10/16/2017 06/04/16   Charlynne Pander, MD  oxyCODONE (ROXICODONE) 5 MG immediate release tablet Take 1 tablet (5 mg total) by mouth every 6 (six) hours as needed for severe pain. 10/29/17   Coletta Memos, MD  Oxycodone HCl 10 MG TABS Take 10 mg by mouth every 4 (four) hours as needed (for pain).    [provider]  tiZANidine (ZANAFLEX) 4 MG tablet Take 1 tablet (4 mg total) by mouth every 6 (six) hours as needed for muscle spasms. 10/29/17   Coletta Memos, MD  traZODone (DESYREL) 50 MG tablet Take 50 mg by mouth at bedtime.    [provider]  vitamin C (ASCORBIC ACID) 500 MG tablet Take 500 mg by mouth daily.    [provider]    Family History No family history on file.  Social History Social History   Tobacco Use  . Smoking status: Current Every Day Smoker  . Smokeless tobacco: Former Neurosurgeon  . Tobacco comment: 3 cigarettes  Substance Use Topics  . Alcohol use: No  . Drug use: No     Allergies   Pepto-bismol [bismuth] and Red dye   Review of Systems Review of Systems  Skin: Positive for color change.  Neurological: Negative for weakness and numbness.  All other systems reviewed  and are negative.    Physical Exam Updated Vital Signs BP 132/88 (BP Location: Right Arm)   Pulse 96   Temp 98.2 F (36.8 C) (Oral)   Resp 18   Ht  (1.727 m)   Wt 99.8 kg (220 lb)   SpO2 99%   BMI 33.45 kg/m   Physical Exam  Constitutional: She is oriented to person, place, and time. She appears well-developed and well-nourished.  Overweight, no acute distress  HENT:  Head: Normocephalic and atraumatic.  Cardiovascular: Normal rate and regular rhythm.  Pulmonary/Chest: Effort normal. No respiratory distress.  Musculoskeletal: She exhibits no edema or deformity.  Neurological: She is alert and oriented to person, place, and time.  Skin: Skin is warm and dry.  Gaping 6 cm  laceration over the right shin, bleeding controlled, 2+ DP pulse  Psychiatric: She has a normal mood and affect.  Nursing note and vitals reviewed.    ED Treatments / Results  Labs (all labs ordered are listed, but only abnormal results are displayed) Labs Reviewed - No data to display  EKG None  Radiology No results found.  Procedures .Marland KitchenLaceration Repair Date/Time: 02/02/2018 5:50 AM Performed by: Shon Baton, MD Authorized by: Shon Baton, MD   Consent:    Consent obtained:  Verbal   Consent given by:  Patient   Risks discussed:  Infection and pain Anesthesia (see MAR for exact dosages):    Anesthesia method:  Local infiltration   Local anesthetic:  Lidocaine 2% WITH epi Laceration details:    Location:  Leg   Leg location:  R lower leg   Length (cm):  6   Depth (mm):  5 Repair type:    Repair type:  Simple Pre-procedure details:    Preparation:  Patient was prepped and draped in usual sterile fashion Exploration:    Hemostasis achieved with:  Direct pressure and epinephrine   Wound extent: no muscle damage noted, no nerve damage noted, no tendon damage noted and no underlying fracture noted     Contaminated: no   Treatment:    Area cleansed with:  Betadine   Amount of cleaning:  Standard   Irrigation solution:  Sterile saline   Irrigation method:  Syringe   Visualized foreign bodies/material removed: no   Skin repair:    Repair method:  Sutures   Suture size:  4-0   Suture material:  Prolene   Suture technique:  Horizontal mattress   Number of sutures:  3 Approximation:    Approximation:  Close Post-procedure details:    Dressing:  Open (no dressing)   Patient tolerance of procedure:  Tolerated well, no immediate complications   (including critical care time)  Medications Ordered in ED Medications  lidocaine-EPINEPHrine (XYLOCAINE W/EPI) 2 %-1:100000 (with pres) injection 20 mL (has no administration in time range)  Tdap (BOOSTRIX)  injection 0.5 mL (0.5 mLs Intramuscular Given 02/02/18 0247)  ibuprofen (ADVIL,MOTRIN) tablet 400 mg (400 mg Oral Given 02/02/18 0250)  lidocaine-EPINEPHrine (XYLOCAINE W/EPI) 1 %-1:100000 (with pres) injection (1 mL  Given by Other 02/02/18 1610)     Initial Impression / Assessment and Plan / ED Course  I have reviewed the triage vital signs and the nursing notes.  Pertinent labs & imaging results that were available during my care of the patient were reviewed by me and considered in my medical decision making (see chart for details).     Patient presents with a laceration to the right lower extremity.  She is  overall nontoxic-appearing.  Tetanus was updated.  Laceration was repaired.  Recommend suture removal in approximately 10 days.  Ibuprofen as needed for pain.  After history, exam, and medical workup I feel the patient has been appropriately medically screened and is safe for discharge home. Pertinent diagnoses were discussed with the patient. Patient was given return precautions.   Final Clinical Impressions(s) / ED Diagnoses   Final diagnoses:  Laceration of right lower leg, initial encounter    ED Discharge Orders    None       Shon Baton, MD 02/02/18 [phone]

## 2018-02-14 ENCOUNTER — Other Ambulatory Visit: Payer: Self-pay

## 2018-02-14 ENCOUNTER — Emergency Department (HOSPITAL_BASED_OUTPATIENT_CLINIC_OR_DEPARTMENT_OTHER)
Admission: EM | Admit: 2018-02-14 | Discharge: 2018-02-14 | Disposition: A | Discharge status: Home / Self Care | Payer: Medicaid Other | Attending: Emergency Medicine | Admitting: Emergency Medicine

## 2018-02-14 ENCOUNTER — Encounter (HOSPITAL_BASED_OUTPATIENT_CLINIC_OR_DEPARTMENT_OTHER): Payer: Self-pay | Admitting: Emergency Medicine

## 2018-02-14 DIAGNOSIS — F17210 Nicotine dependence, cigarettes, uncomplicated: Secondary | ICD-10-CM | POA: Diagnosis not present

## 2018-02-14 DIAGNOSIS — Z4802 Encounter for removal of sutures: Secondary | ICD-10-CM

## 2018-02-14 DIAGNOSIS — Z79899 Other long term (current) drug therapy: Secondary | ICD-10-CM | POA: Diagnosis not present

## 2018-02-14 MED ORDER — BACITRACIN ZINC 500 UNIT/GM EX OINT: TOPICAL_OINTMENT | Freq: Two times a day (BID) | CUTANEOUS | Status: DC

## 2018-02-14 NOTE — ED Triage Notes (Signed)
Patient has noted wound with stiches to the right lower leg, here to get them out

## 2018-02-14 NOTE — ED Provider Notes (Signed)
[facility] EMERGENCY DEPARTMENT Provider Note   CSN: [id number] Arrival date & time: 02/14/18  1614     History   Chief Complaint Chief Complaint  Patient presents with  . Suture / Staple Removal    HPI Joann Hunt is a 27 y.o. female presents to ED for removal.  She patient and her right lower extremity on May 27.  She hit her leg on the end of the bed.  She has been using antibiotic ointment and keeping the area dressed as directed.  She has not taken any medications to help with pain.  She reports pain worse with palpation at the area and including when her son accidentally kicked her in that area while he was sleeping.  Denies any drainage, fevers, surrounding redness or warmth.  HPI  Past Medical History:  Diagnosis Date  . Anxiety   . Depression   . Herniated nucleus pulposus, C6-7     Patient Active Problem List   Diagnosis Date Noted  . HNP (herniated nucleus pulposus), cervical 10/29/2017    Past Surgical History:  Procedure Laterality Date  . CERVICAL DISC ARTHROPLASTY Right 10/29/2017   Procedure: ARTIFICIAL DISC REPLACEMENT, CERVICAL SIX- CERVICAL SEVEN;  Surgeon: Coletta Memos, MD;  Location: [facility] OR;  Service: Neurosurgery;  Laterality: Right;  . FOOT SURGERY       OB History   None      Home Medications    Prior to Admission medications   Medication Sig Start Date End Date Taking? Authorizing Provider  acetaminophen (TYLENOL) 500 MG tablet Take 1,000 mg by mouth every 6 (six) hours as needed for moderate pain or headache.    [provider]  busPIRone (BUSPAR) 10 MG tablet Take 10 mg by mouth 2 (two) times daily.    [provider]  folic acid (FOLVITE) 400 MCG tablet Take 400 mcg by mouth daily.    [provider]  ibuprofen (ADVIL,MOTRIN) 800 MG tablet Take 1 tablet (800 mg total) by mouth 3 (three) times daily. Patient not taking: Reported on 10/16/2017 05/17/17   Arby Barrette, MD  Multiple Vitamins-Minerals  (MULTIVITAMIN PO) Take 1 tablet by mouth daily.    [provider]  ondansetron (ZOFRAN ODT) 4 MG disintegrating tablet 4mg  ODT q6 hours prn nausea/vomit Patient not taking: Reported on 10/16/2017 06/04/16   Charlynne Pander, MD  oxyCODONE (ROXICODONE) 5 MG immediate release tablet Take 1 tablet (5 mg total) by mouth every 6 (six) hours as needed for severe pain. 10/29/17   Coletta Memos, MD  Oxycodone HCl 10 MG TABS Take 10 mg by mouth every 4 (four) hours as needed (for pain).    [provider]  tiZANidine (ZANAFLEX) 4 MG tablet Take 1 tablet (4 mg total) by mouth every 6 (six) hours as needed for muscle spasms. 10/29/17   Coletta Memos, MD  traZODone (DESYREL) 50 MG tablet Take 50 mg by mouth at bedtime.    [provider]  vitamin C (ASCORBIC ACID) 500 MG tablet Take 500 mg by mouth daily.    [provider]    Family History History reviewed. No pertinent family history.  Social History Social History   Tobacco Use  . Smoking status: Current Every Day Smoker  . Smokeless tobacco: Former Neurosurgeon  . Tobacco comment: 3 cigarettes  Substance Use Topics  . Alcohol use: No  . Drug use: No     Allergies   Pepto-bismol [bismuth] and Red dye   Review of Systems  Review of Systems  Constitutional: Negative for chills and fever.  Skin: Positive for wound.  Neurological: Negative for weakness and numbness.     Physical Exam Updated Vital Signs BP (!) 134/98   Pulse (!) 109   Temp 97.9 F (36.6 C)   Resp 18   Ht 5\' 8"  (1.727 m)   Wt 99.8 kg (220 lb)   SpO2 100%   BMI 33.45 kg/m   Physical Exam  Constitutional: She appears well-developed and well-nourished. No distress.  HENT:  Head: Normocephalic and atraumatic.  Eyes: Conjunctivae and EOM are normal. No scleral icterus.  Neck: Normal range of motion.  Pulmonary/Chest: Effort normal. No respiratory distress.  Neurological: She is alert.  Skin: No rash noted. She is not diaphoretic.    Well-healing 6 cm laceration to right shin.  Psychiatric: She has a normal mood and affect.  Nursing note and vitals reviewed.    ED Treatments / Results  Labs (all labs ordered are listed, but only abnormal results are displayed) Labs Reviewed - No data to display  EKG None  Radiology No results found.  Procedures .Suture Removal Date/Time: 02/14/2018 4:40 PM Performed by: Dietrich PatesKhatri, Jonnette Nuon, PA-C Authorized by: Dietrich PatesKhatri, Nashayla Telleria, PA-C   Consent:    Consent obtained:  Verbal   Consent given by:  Patient   Risks discussed:  Bleeding, pain and wound separation Location:    Location:  Lower extremity   Lower extremity location:  Leg   Leg location:  R lower leg Procedure details:    Wound appearance:  No signs of infection, good wound healing, clean and red   Number of sutures removed:  3 Post-procedure details:    Post-removal:  Antibiotic ointment applied   Patient tolerance of procedure:  Tolerated well, no immediate complications   (including critical care time)  Medications Ordered in ED Medications  bacitracin ointment (has no administration in time range)     Initial Impression / Assessment and Plan / ED Course  I have reviewed the triage vital signs and the nursing notes.  Pertinent labs & imaging results that were available during my care of the patient were reviewed by me and considered in my medical decision making (see chart for details).     Patient presents for suture removal.  She had sutures placed on her right lower extremity on May 27 after sustaining a laceration after hitting her leg on the end of her bed.  States that area has been healing well.  Sutures removed successfully.  Advised to continue wound care.  Portions of this note were generated with Scientist, clinical (histocompatibility and immunogenetics)Dragon dictation software. Dictation errors may occur despite best attempts at proofreading.   Final Clinical Impressions(s) / ED Diagnoses   Final diagnoses:  Visit for suture removal    ED  Discharge Orders    None       Dietrich PatesKhatri, Kasidee Voisin, PA-C 02/14/18 1641    Long, Arlyss RepressJoshua G, MD 02/14/18 1752

## 2019-04-30 IMAGING — RF DG C-ARM 61-120 MIN
1 series · 3 of 3 positions shown · non-contrast
Comparison: None.

CLINICAL DATA: Disc replacement at C6-7.

EXAM:
CERVICAL SPINE - 2-3 VIEW; DG C-ARM 61-120 MIN

[Series 1: run · 3 of 3 slices shown]
[im 1/3]
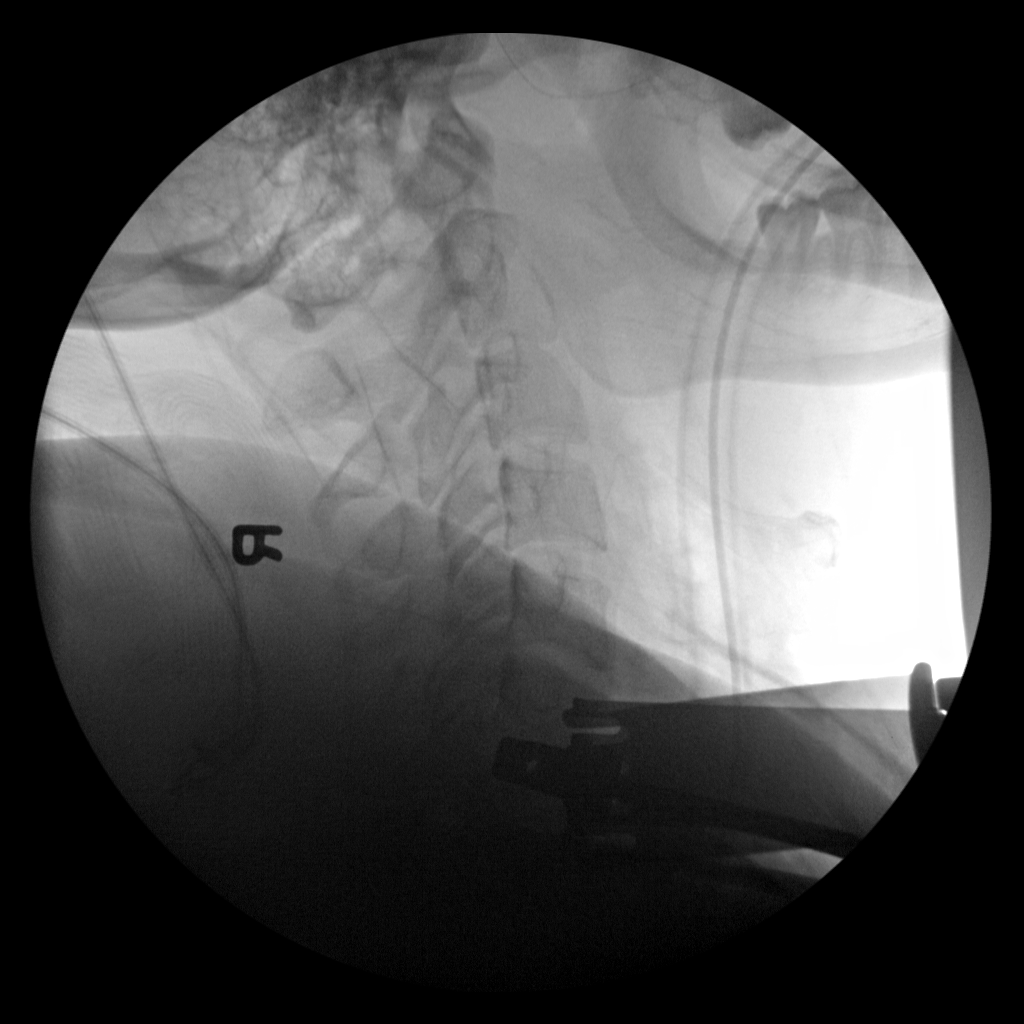
[im 2/3]
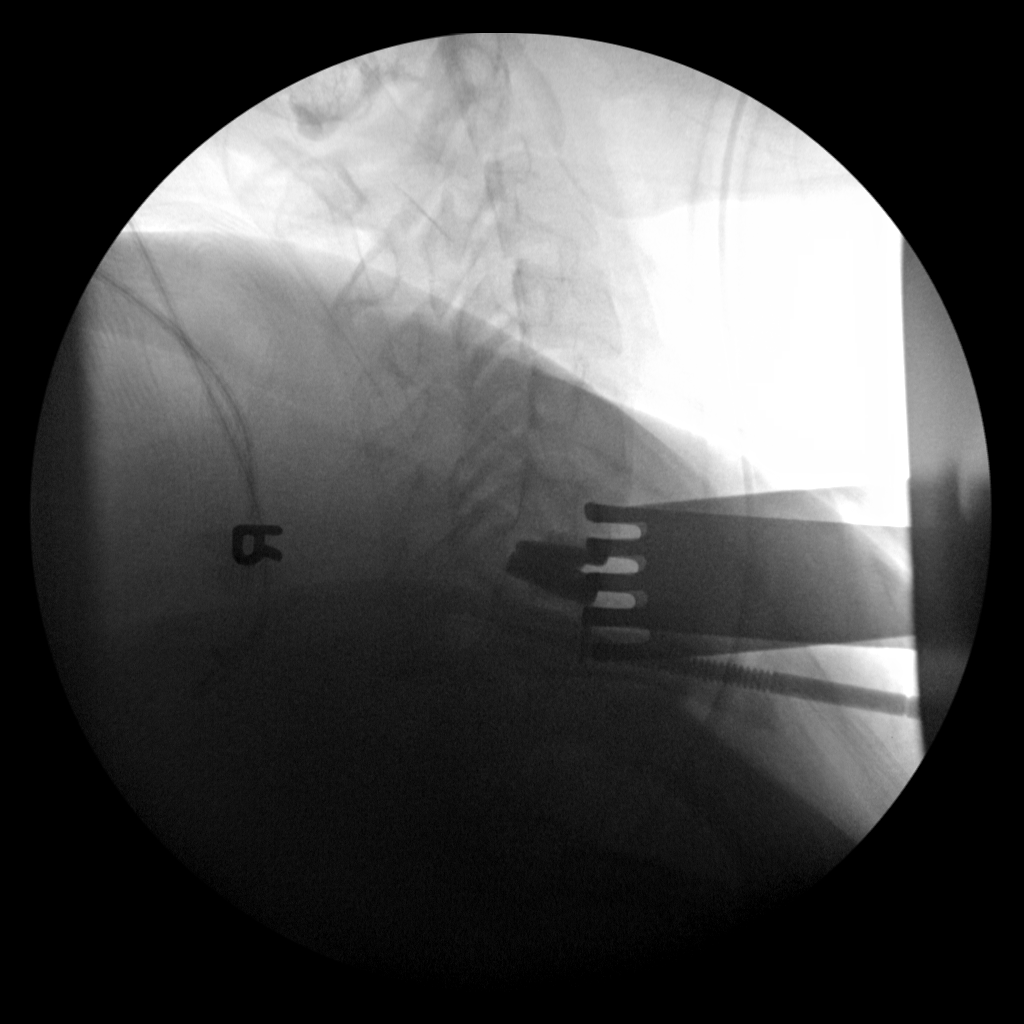
[im 3/3]
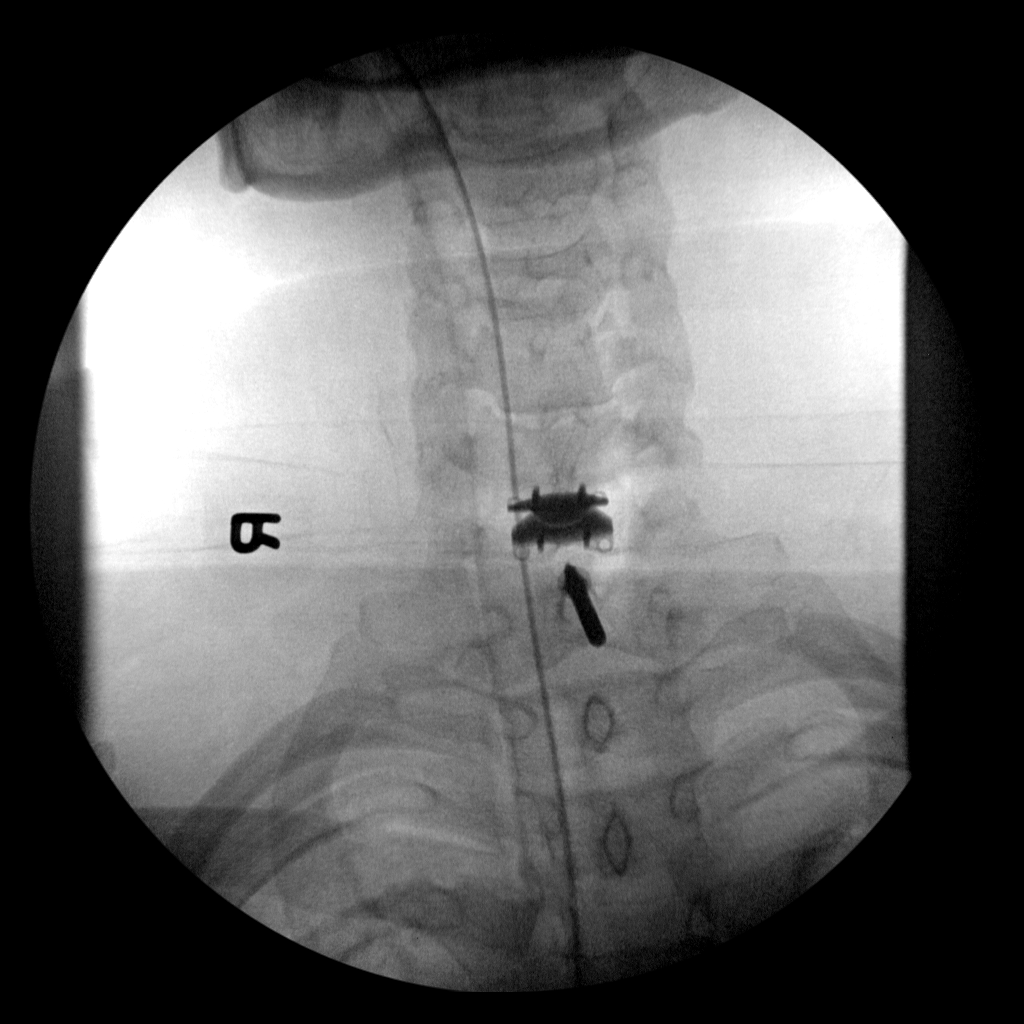

[3 of 3 positions shown; findings below may reference images not displayed]

FINDINGS: Three intraoperative images are submitted. Cervical disc replacement
is noted at C6-7. Alignment is anatomic.
IMPRESSION: Cervical disc replacement at C6-7.
